# Patient Record
Sex: Male | Born: 2000 | Hispanic: Yes | Marital: Single | Smoking: Never smoker
Health system: Western US, Academic
[De-identification: ages and names within clinical notes are randomized; demographics above are authoritative.]

## PROBLEM LIST (undated history)

## (undated) DIAGNOSIS — Z789 Other specified health status: Secondary | ICD-10-CM

## (undated) HISTORY — DX: Cervicalgia: M54.2

## (undated) HISTORY — DX: Gastro-esophageal reflux disease without esophagitis: K21.9

## (undated) HISTORY — DX: Dorsalgia, unspecified: M54.9

## (undated) HISTORY — DX: Presence of spectacles and contact lenses: Z97.3

## (undated) HISTORY — DX: Other general symptoms and signs: R68.89

## (undated) HISTORY — DX: Other chronic pain: G89.29

## (undated) MED ORDER — AZITHROMYCIN 1 GM OR PACK
PACK | ORAL | 0 refills | Status: AC
Start: 2021-09-08 — End: ?

---
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
## 2020-05-14 ENCOUNTER — Emergency Department: Payer: No Typology Code available for payment source

## 2020-05-14 ENCOUNTER — Emergency Department
Admission: EM | Admit: 2020-05-14 | Discharge: 2020-05-14 | Disposition: A | Discharge status: Home / Self Care | Payer: No Typology Code available for payment source | Attending: Emergency Medicine | Admitting: Emergency Medicine

## 2020-05-14 DIAGNOSIS — R0789 Other chest pain: Secondary | ICD-10-CM

## 2020-05-14 DIAGNOSIS — F1290 Cannabis use, unspecified, uncomplicated: Secondary | ICD-10-CM | POA: Insufficient documentation

## 2020-05-14 DIAGNOSIS — R079 Chest pain, unspecified: Secondary | ICD-10-CM | POA: Insufficient documentation

## 2020-05-14 LAB — CBC WITH DIFF, BLOOD
ANC automated: 4.5 10*3/uL (ref 2.0–8.1)
Basophils %: 1 %
Basophils Absolute: 0.1 10*3/uL (ref 0.0–0.2)
Eosinophils %: 0.4 %
Eosinophils Absolute: 0 10*3/uL (ref 0.0–0.5)
Hematocrit: 43.5 % (ref 39.5–50.0)
Hgb: 15.2 G/DL (ref 13.5–16.9)
Lymphocytes %: 26.3 %
Lymphocytes Absolute: 1.8 10*3/uL (ref 0.9–3.3)
MCH: 30.4 PG (ref 27.0–33.5)
MCHC: 35 G/DL (ref 32.0–35.5)
MCV: 86.9 FL (ref 81.5–97.0)
MPV: 9.1 FL (ref 7.2–11.7)
Monocytes %: 7.1 %
Monocytes Absolute: 0.5 10*3/uL (ref 0.0–0.8)
Neutrophils % (A): 65.2 %
PLT Count: 177 10*3/uL (ref 150–400)
RBC: 5.01 10*6/uL (ref 4.38–5.62)
RDW-CV: 12.4 % (ref 11.6–14.4)
White Bld Cell Count: 6.9 10*3/uL (ref 4.0–10.5)

## 2020-05-14 LAB — BASIC METABOLIC PANEL, BLOOD
BUN: 14 mg/dL (ref 7–25)
CO2: 27 mmol/L (ref 21–31)
Calcium: 9.7 mg/dL (ref 8.6–10.3)
Chloride: 103 mmol/L (ref 98–107)
Creat: 1 mg/dL (ref 0.7–1.3)
Electrolyte Balance: 7 mmol/L (ref 2–12)
Glucose: 102 mg/dL (ref 70–115)
Potassium: 3.9 mmol/L (ref 3.5–5.1)
Sodium: 137 mmol/L (ref 136–145)
eGFR - high estimate: >60 (ref >59)
eGFR - low estimate: >60 (ref >59)

## 2020-05-14 LAB — ECG 12-LEAD
P AXIS: 83 Deg
PR INTERVAL: 170 ms
QTC INTERVAL: 395 ms
R-R INTERVAL AVERAGE: 631 ms
T AXIS: 51 Deg
VENTRICULAR RATE: 95 {beats}/min

## 2020-05-14 LAB — TROPONIN I, HIGH SENSITIVITY
Troponin I, High Sensitivity: <3 ng/L (ref 0–20)
Troponin I, High Sensitivity: <3 ng/L (ref 0–20)

## 2020-05-14 NOTE — ED Provider Notes (Signed)
CHIEF COMPLAINT  Chest Pain - Adult (C/o of palpitations, racing heart rate,  used cocaine, last use was Sunday.)      HISTORY OF PRESENT ILLNESS:   Jean Skow is a 20 year old male who presents with a chief complaint of chest pain. States 3 days ago went to a party, took something (thinks it was cocaine), and felt his heart wasn't beating normally. States has been waking up with palpitations. No hemoptysis, no unilateral leg swelling, no recent travel or surgery, no history of blood clots, no hormone use, no pleuritic chest pain. Denies fevers, chills, shortness of breath, abdominal pain, nausea, vomiting, diarrhea, dysuria, headaches, numbness, tingling, weakness.     Location: left chest  Radiation: none  Quality: fast   Severity: moderate   Duration: 3 days   Timing: intermittent     REVIEW OF SYSTEMS:  Constitutional: no fever  CV: no chest pain  Resp: no shortness of breath  GI: no vomiting  GU: no dysuria    All other systems reviewed and negative except as noted above    PAST MEDICAL HISTORY:  History reviewed. No pertinent past medical history.   There are no problems to display for this patient.       SURGICAL HISTORY:  Denies     ALLERGIES:  No Known Allergies      CURRENT MEDICATIONS:   No current outpatient medications  Please seen nursing notes    FAMILY HISTORY:  No family history on file.  Reviewed and noncontributory     SOCIAL HISTORY:  Tobacco: no  Alcohol: + socially   Drug use: + thc, + cocaine    VITAL SIGNS:  First Vitals [05/14/20 2008]   Temperature Heart Rate Respirations Blood pressure (BP) SpO2   98.5 F (36.9 C) 112 18 (!) 148/98 96 %       PHYSICAL EXAM:  General: Awake, Alert , appears to be in no distress   Head: Normocephalic, atraumatic   Eyes:No scleral icterus, no conjunctival injection   ENT: Normal appearing ears externally, normal appearing nose externally    Neck: Supple, no tracheal deviation   Respiratory: normal effort, no audible stridor   Cardiovascular:  normal  rate, warm and well perfused   Abdomen: Non-distended   Skin: No jaundice, no rash   Extremities:  No edema  Neuro: Face symmetric, normal speech    LABS:   Abnormal Labs Reviewed - No abnormal labs to display    IMAGING:  X-Ray Chest Single View   Preliminary Result   FINDINGS/      Cardiomediastinal silhouette is within normal limits. Pulmonary vasculature is within normal limits. There is no large pleural effusion. There is no focal consolidation. There is no pneumothorax.           MEDICAL DECISION MAKING:  Lawayne Hartig is a 20 year old male who presents with a chief complaint of chest pain. Patient tachycardic on arrival but no longer tachycardic on exam. Consider ACS. Consider GI causes of chest pain including gastritis vs pancreatitis. Consider musculoskeletal causes of chest pain including costochondritis. Lungs are clear to auscultation bilaterally, doubt pneumonia.  Considered but less likely PE, no risk factors, no evidence of DVT, Well's score low risk.  Strongly doubt aortic dissection vs pneumothorax vs cardiac tamponade based on history and physical exam. Will get labs, EKG, imaging.     Troponin within normal limits, EKG without signs of ischemia, no major electrolyte abnormality, x-ray within normal limits,  we will plan to discharge at this time with outpatient follow-up.    I have reviewed the patient's labs which were notable for (noted above and in ED Course) I reviewed the patient's imaging which showed (noted above and in ED Course). I have also reviewed prior records which were summarized in my HPI .    ED Course:    Workup Summary         Value Comment By Time     Troponin I, High Sensitivity: <3 (Reviewed) Abigail Miyamoto, MD 04/12 2227     Troponin I, High Sensitivity: <3 (Reviewed) Abigail Miyamoto, MD 04/12 2324      CXR without acute findings. Abigail Miyamoto, MD 04/12 2324            DIAGNOSIS:    ICD-10-CM ICD-9-CM   1. Chest pain, unspecified type  R07.9 786.50            Darnell Level, MD  Resident  05/15/20 [phone]    ATTENDING ATTESTATION:  I evaluated the patient concurrently with the Resident.  I discussed the case with the Resident and agree with the findings and plan as documented by the Resident.  Any additions or revisions are included in the record as necessary.    Abigail Miyamoto, MD         Abigail Miyamoto, MD  05/16/20 [phone]

## 2020-05-14 NOTE — ED Notes (Signed)
Pt d/c'd home with info on aci's, and follow up verbal understanding nad

## 2020-05-14 NOTE — Discharge Instructions (Addendum)
You were seen tonight for your chest pain. Your blood tests did not show any concerning findings.    Please follow-up with your primary care provider in 2-3 days for repeat evaluation and, if indicated, further management of your symptoms.    Return to the emergency department IMMEDIATELY for fever, vomiting, worsening pain, shortness of breath, fainting or passing out, or for anything else that concerns you.

## 2020-05-14 NOTE — ED EKG Interpretation (Signed)
ED EKG Interpretation    EKG: Normal Sinus Rhythm with Normal Axis and no ischemic changes.

## 2020-05-15 LAB — ECG 12-LEAD
QRS INTERVAL/DURATION: 82 ms
QT: 343 ms
R AXIS: 75 Deg

## 2021-01-15 ENCOUNTER — Encounter: Payer: Self-pay | Admitting: Family Practice

## 2021-01-15 ENCOUNTER — Ambulatory Visit: Payer: No Typology Code available for payment source | Attending: Family Practice | Admitting: Family Practice

## 2021-01-15 VITALS — BP 148/80 | HR 77 | Temp 97.6°F | Ht 67.99 in | Wt 146.2 lb

## 2021-01-15 DIAGNOSIS — Z113 Encounter for screening for infections with a predominantly sexual mode of transmission: Secondary | ICD-10-CM | POA: Insufficient documentation

## 2021-01-15 DIAGNOSIS — Z1159 Encounter for screening for other viral diseases: Secondary | ICD-10-CM | POA: Insufficient documentation

## 2021-01-15 DIAGNOSIS — Z0000 Encounter for general adult medical examination without abnormal findings: Secondary | ICD-10-CM | POA: Insufficient documentation

## 2021-01-15 DIAGNOSIS — Z6822 Body mass index (BMI) 22.0-22.9, adult: Secondary | ICD-10-CM | POA: Insufficient documentation

## 2021-01-15 DIAGNOSIS — F1290 Cannabis use, unspecified, uncomplicated: Secondary | ICD-10-CM | POA: Insufficient documentation

## 2021-01-15 DIAGNOSIS — Z7689 Persons encountering health services in other specified circumstances: Secondary | ICD-10-CM | POA: Insufficient documentation

## 2021-01-15 DIAGNOSIS — Z23 Encounter for immunization: Secondary | ICD-10-CM | POA: Insufficient documentation

## 2021-01-15 NOTE — Patient Instructions (Signed)
It was nice seeing you, Dalton Mcintosh  Please remember to follow up with [place] to review your labs together

## 2021-01-15 NOTE — Assessment & Plan Note (Signed)
Routine labs ordered  HPV vaccine #1 given; will return for second  Influenza vaccine given  Will screen for Hep C and STD

## 2021-01-15 NOTE — Progress Notes (Addendum)
[facility]    Date: 01/15/2021  Patient ID: Dalton Mcintosh, 20 year old, male  MRN: [id number]  Provider Name: Orlinda Blalock, NP    Chief Complaint: Establish Care and Physical      HPI:  Pt is a 20 year old male who presents today for c/o  Establish care and physical    He denies any acute complaints today  He has hx of cocaine use; last use was about one year ago  Reports he is currently using marijuana; 1-2 times per week for recreational use  Denies hx of anxiety or depression  No chest pain or pressure  No palpitations at this time      PHQ2 score: 0      ROS:  Negative except HPI above    Past Medical History:  Patient Active Problem List   Diagnosis   . Routine general medical examination at a health care facility   . Encounter to establish care   . Encounter for screening examination for sexually transmitted disease   . Encounter for hepatitis C screening test for low risk patient   . Marijuana use   . Need for vaccination       Past Surgical History:  No past surgical history on file.    Medications:  No current outpatient medications on file.     No current facility-administered medications for this visit.       Allergies:   Patient has no known allergies.    Family Medical History:  Family History   Problem Relation Name Age of Onset   . No Known Problems Mother     . No Known Problems Sister two sisters        Social History:  Social History     Occupational History   . Not on file   Tobacco Use   . Smoking status: Never   . Smokeless tobacco: Never   Substance and Sexual Activity   . Alcohol use: Yes     Comment: 1-2 beers on weekend sometimes   . Drug use: Yes     Types: Cocaine, Marijuana     Comment: weed; weekly; no longer using cocaine   . Sexual activity: Yes     Partners: Female     Birth control/protection: Condom         Vital Signs:  BP 148/80   Pulse 77   Temp 97.6 F (36.4 C)   Ht 5' 7.99" (1.727 m)   Wt 66.3 kg (146 lb 2.6 oz)   BMI 22.23 kg/m   Body mass  index is 22.23 kg/m.    Physical Examination:  Physical Exam  Vitals reviewed.   HENT:      Head: Normocephalic.      Comments: Wearing mask for COVID precautions     Right Ear: External ear normal.      Left Ear: External ear normal.   Cardiovascular:      Rate and Rhythm: Normal rate and regular rhythm.   Pulmonary:      Effort: Pulmonary effort is normal. No respiratory distress.   Skin:     General: Skin is warm and dry.   Neurological:      Mental Status: He is alert and oriented to person, place, and time.   Psychiatric:         Mood and Affect: Mood normal.           Recent Labs:    Admission on  05/14/2020, Discharged on 05/14/2020   Component Date Value Ref Range Status   . ECG INTERPRETATION 05/14/2020 SINUS RHYTHM NORMAL ECG  Reviewed By Earnie Larsson, MD  05/15/2020 12:02:42 PM   Final   . VENTRICULAR RATE 05/14/2020 95  BPM Final   . PR INTERVAL 05/14/2020 170  ms Final   . QRS INTERVAL/DURATION 05/14/2020 82  ms Final   . QT 05/14/2020 343  ms Final   . QTC INTERVAL 05/14/2020 395  ms Final   . P AXIS 05/14/2020 82  Deg Final   . R AXIS 05/14/2020 75  Deg Final   . T AXIS 05/14/2020 51  Deg Final   . R-R INTERVAL AVERAGE 05/14/2020 631  ms Final   . White Bld Cell Count 05/14/2020 6.9  4.0 - 10.5 THOUS/MCL Final   . RBC 05/14/2020 5.01  4.38 - 5.62 MILL/MCL Final   . Hgb 05/14/2020 15.2  13.5 - 16.9 G/DL Final   . Hematocrit 05/14/2020 43.5  39.5 - 50.0 % Final   . MCV 05/14/2020 86.9  81.5 - 97.0 FL Final   . MCH 05/14/2020 30.4  27.0 - 33.5 PG Final   . MCHC 05/14/2020 35.0  32.0 - 35.5 G/DL Final   . RDW-CV 05/14/2020 12.4  11.6 - 14.4 % Final   . PLT Count 05/14/2020 177  150 - 400 THOUS/MCL Final   . MPV 05/14/2020 9.1  7.2 - 11.7 FL Final   . Diff Type 05/14/2020 DIFFERENTIAL PERFORMED BY AUTOMATED ANALYSIS   Final   . Neutrophils % (A) 05/14/2020 65.2  % Final   . ANC automated 05/14/2020 4.5  2.0 - 8.1 THOUS/MCL Final   . Lymphocytes % 05/14/2020 26.3  % Final   . Lymphocytes Absolute 05/14/2020 1.8   0.9 - 3.3 THOUS/MCL Final   . Monocytes % 05/14/2020 7.1  % Final   . Monocytes Absolute 05/14/2020 0.5  0.0 - 0.8 THOUS/MCL Final   . Eosinophils % 05/14/2020 0.4  % Final   . Eosinophils Absolute 05/14/2020 0.0  0.0 - 0.5 THOUS/MCL Final   . Basophils % 05/14/2020 1.0  % Final   . Basophils Absolute 05/14/2020 0.1  0.0 - 0.2 THOUS/MCL Final   . Sodium 05/14/2020 137  136 - 145 mmol/L Final   . Potassium 05/14/2020 3.9  3.5 - 5.1 mmol/L Final   . Chloride 05/14/2020 103  98 - 107 mmol/L Final   . CO2 05/14/2020 27  21 - 31 mmol/L Final   . Electrolyte Balance 05/14/2020 7  2 - 12 mmol/L Final   . Glucose 05/14/2020 102  70 - 115 mg/dL Final    Comment:    Normal Fasting Glucose:  <100 mg/dL  Impaired Fasting Glucose: 100-125 mg/dL  Provisional DX of diabetes(must be confirmed) >125 mg/dL     . BUN 05/14/2020 14  7 - 25 mg/dL Final   . Creat 05/14/2020 1.0  0.7 - 1.3 mg/dL Final   . eGFR - low estimate 05/14/2020 >60  >59 Final   . eGFR - high estimate 05/14/2020 >60  >59 Final    Comment: (Unit: mL/min/1.73 sq mtr)  The calculated GFR is an estimate and is NOT an accurate reflection of GFR in   patients on dialysis. The accuracy of estimated GFR is also affected by muscle   mass, and medications that affect renal tubular secretion of creatinine.  If estimated GFR will directly affect clinical decision making, consider using cystatin C to estimate GFR, and/or  consult with nephrology. eGFR was calculated using the MDRD equation (2006).     . Calcium 05/14/2020 9.7  8.6 - 10.3 mg/dL Final   . Troponin I, High Sensitivity 05/14/2020 <3  0 - 20 ng/L Final    Comment:    These values were determined with a high sensitivity troponin I assay. The 99th   percentile upper reference limits established by the manufacturer are:       Females: 15 ng/L       Males:   20 ng/L       Overall: 18 ng/L     . Troponin I, High Sensitivity 05/14/2020 <3  0 - 20 ng/L Final    Comment:    These values were determined with a high  sensitivity troponin I assay. The 99th   percentile upper reference limits established by the manufacturer are:       Females: 15 ng/L       Males:   20 ng/L       Overall: 18 ng/L               Assessment and Plan:  Problem List Items Addressed This Visit        Other    Routine general medical examination at a health care facility - Primary     Routine labs ordered  HPV vaccine #1 given; will return for second  Influenza vaccine given  Will screen for Hep C and STD         Relevant Orders    CBC w/ Diff Lavender    Lipid Panel Green Plasma Separator Tube    Comprehensive Metabolic Panel    Vitamin D, 25-OH Total Yellow serum separator tube    Vitamin B12, Blood Green Plasma Separator Tube    HIV 1/2 Antibody & P24 Antigen Assay    C. trachomatis + N. gonorrhoeae by PCR    Syphilis Screen, Blood Yellow serum separator tube    Hepatitis C Antibody    Encounter to establish care    Relevant Orders    CBC w/ Diff Lavender    Lipid Panel Green Plasma Separator Tube    Comprehensive Metabolic Panel    Vitamin D, 25-OH Total Yellow serum separator tube    Vitamin B12, Blood Green Plasma Separator Tube    HIV 1/2 Antibody & P24 Antigen Assay    C. trachomatis + N. gonorrhoeae by PCR    Syphilis Screen, Blood Yellow serum separator tube    Hepatitis C Antibody    Encounter for screening examination for sexually transmitted disease    Relevant Orders    CBC w/ Diff Lavender    Lipid Panel Green Plasma Separator Tube    Comprehensive Metabolic Panel    Vitamin D, 25-OH Total Yellow serum separator tube    Vitamin B12, Blood Green Plasma Separator Tube    HIV 1/2 Antibody & P24 Antigen Assay    C. trachomatis + N. gonorrhoeae by PCR    Syphilis Screen, Blood Yellow serum separator tube    Hepatitis C Antibody    Encounter for hepatitis C screening test for low risk patient    Relevant Orders    CBC w/ Diff Lavender    Lipid Panel Green Plasma Separator Tube    Comprehensive Metabolic Panel    Vitamin D, 25-OH Total Yellow  serum separator tube    Vitamin B12, Blood Green Plasma Separator Tube    HIV 1/2 Antibody & P24 Antigen Assay  C. trachomatis + N. gonorrhoeae by PCR    Syphilis Screen, Blood Yellow serum separator tube    Hepatitis C Antibody    Marijuana use     Encouraged cessation  Pt verbalized understanding          Need for vaccination

## 2021-01-15 NOTE — Assessment & Plan Note (Signed)
Encouraged cessation  Pt verbalized understanding

## 2021-01-29 ENCOUNTER — Ambulatory Visit: Payer: No Typology Code available for payment source | Admitting: Family Practice

## 2021-02-13 ENCOUNTER — Ambulatory Visit: Payer: No Typology Code available for payment source | Admitting: Nurse Practitioner

## 2021-02-15 LAB — CBC WITH DIFF, BLOOD
Abs Basophils: 36 cells/uL (ref 0–200)
Abs Eosinophils: 18 cells/uL (ref 15–500)
Abs Lymphs: 1050 cells/uL (ref 850–3900)
Abs Monocytes: 570 cells/uL (ref 200–950)
Abs Neutrophils: 7227 cells/uL (ref 1500–7800)
Basophils: 0.4 %
Eosinophils: 0.2 %
HCT: 47.4 % (ref 38.5–50.0)
HGB: 16.2 g/dL (ref 13.2–17.1)
Lymps: 11.8 %
MCH: 30.3 pg (ref 27.0–33.0)
MCHC: 34.2 g/dL (ref 32.0–36.0)
MCV: 88.8 fL (ref 80.0–100.0)
MPV: 12.1 fL (ref 7.5–12.5)
Monocytes: 6.4 %
PLT: 212 10*3/uL (ref 140–400)
RBC: 5.34 10*6/uL (ref 4.20–5.80)
RDW: 12.1 % (ref 11.0–15.0)
SEGS: 81.2 %
WBC: 8.9 10*3/uL (ref 3.8–10.8)

## 2021-02-15 LAB — COMPREHENSIVE METABOLIC PANEL, BLOOD
ALT (SGPT): 24 U/L (ref 9–46)
AST (SGOT): 17 U/L (ref 10–40)
Albumin/Glob Ratio: 2.3 (calc) (ref 1.0–2.5)
Albumin: 5.4 g/dL — ABNORMAL HIGH (ref 3.6–5.1)
Alkaline Phos: 83 U/L (ref 36–130)
BUN: 14 mg/dL (ref 7–25)
Bilirubin, Total: 1.4 mg/dL — ABNORMAL HIGH (ref 0.2–1.2)
Calcium: 10.7 mg/dL — ABNORMAL HIGH (ref 8.6–10.3)
Carbon Dioxide: 32 mmol/L (ref 20–32)
Chloride: 103 mmol/L (ref 98–110)
Creatinine: 0.9 mg/dL (ref 0.60–1.24)
EGFR: 124 mL/min/{1.73_m2} (ref >=60)
Globulin: 2.4 g/dL (calc) (ref 1.9–3.7)
Glucose: 111 mg/dL — ABNORMAL HIGH (ref 65–99)
Potassium: 4.4 mmol/L (ref 3.5–5.3)
Sodium: 142 mmol/L (ref 135–146)
Total Protein: 7.8 g/dL (ref 6.1–8.1)

## 2021-02-15 LAB — LIPID(CHOL FRACT) PANEL, BLOOD
Chol/HDLC Ratio: 2.1 (calc) (ref <5.0)
Cholesterol: 212 mg/dL — ABNORMAL HIGH (ref <200)
HDL Cholesterol: 103 mg/dL (ref >=40)
LDL-Cholesterol: 86 mg/dL (calc)
Non-HDL Cholesterol: 109 mg/dL (calc) (ref <130)
Triglycerides: 132 mg/dL (ref <150)

## 2021-02-15 LAB — HIV 1/2 ANTIBODY & P24 ANTIGEN ASSAY, BLOOD: HIV AG/AB, 4th Gen: NONREACTIVE

## 2021-02-15 LAB — VITAMIN B12, BLOOD: Vitamin B12: 396 pg/mL (ref 200–1100)

## 2021-02-15 LAB — SYPHILIS EIA SCREEN, BLOOD: T.pallidum Ab: NEGATIVE

## 2021-02-15 LAB — HEPATITIS C AB, BLOOD
Hepatis C AB Signal/Cut Off: 0.06 (ref <1.00)
Hepatitis C Ab: NONREACTIVE

## 2021-02-15 LAB — VITAMIN D, 25-OH TOTAL: Vitamin D, 25-OH, Total: 16 ng/mL — ABNORMAL LOW (ref 30–100)

## 2021-03-05 ENCOUNTER — Ambulatory Visit: Payer: No Typology Code available for payment source | Attending: Family Practice | Admitting: Family Practice

## 2021-03-05 ENCOUNTER — Encounter: Payer: Self-pay | Admitting: Family Practice

## 2021-03-05 VITALS — BP 136/80 | HR 71 | Temp 97.0°F | Resp 16 | Ht 67.0 in | Wt 140.4 lb

## 2021-03-05 DIAGNOSIS — Z7182 Exercise counseling: Secondary | ICD-10-CM | POA: Insufficient documentation

## 2021-03-05 DIAGNOSIS — Z6821 Body mass index (BMI) 21.0-21.9, adult: Secondary | ICD-10-CM | POA: Insufficient documentation

## 2021-03-05 DIAGNOSIS — R17 Unspecified jaundice: Secondary | ICD-10-CM | POA: Insufficient documentation

## 2021-03-05 DIAGNOSIS — Z713 Dietary counseling and surveillance: Secondary | ICD-10-CM | POA: Insufficient documentation

## 2021-03-05 DIAGNOSIS — F1290 Cannabis use, unspecified, uncomplicated: Secondary | ICD-10-CM | POA: Insufficient documentation

## 2021-03-05 NOTE — Patient Instructions (Signed)
It was nice seeing you, Dalton Mcintosh  Please remember to see the dietician to review healthier eating habits

## 2021-03-05 NOTE — Progress Notes (Signed)
 [name]    Date: 03/05/2021  Patient ID: Dalton Mcintosh, 21 year old, male  MRN: [id number]  Provider Name: Harvie Bridge, NP    Chief Complaint: Lab Review      HPI:  Pt is a 21 year old male who presents today for c/o    #Lab

## 2021-03-05 NOTE — Assessment & Plan Note (Signed)
Remission  Quit 2 weeks ago

## 2021-03-18 DIAGNOSIS — Z713 Dietary counseling and surveillance: Secondary | ICD-10-CM | POA: Insufficient documentation

## 2021-03-18 DIAGNOSIS — R17 Unspecified jaundice: Secondary | ICD-10-CM | POA: Insufficient documentation

## 2021-03-18 DIAGNOSIS — Z7182 Exercise counseling: Secondary | ICD-10-CM | POA: Insufficient documentation

## 2021-03-18 LAB — CALCIUM, IONIZED BLOOD: Calcium, Ionized: 5.2 mg/dL (ref 4.8–5.6)

## 2021-03-18 LAB — PTH INTACT, BLOOD: Parathyroid Hormone, Intact: 67 pg/mL (ref 16–77)

## 2021-03-18 LAB — TSH, BLOOD: TSH: 2.95 mIU/L (ref 0.40–4.50)

## 2021-03-18 NOTE — Assessment & Plan Note (Signed)
Encouraged TLC and daily exercise

## 2021-03-18 NOTE — Assessment & Plan Note (Signed)
Will recheck levels

## 2021-04-02 ENCOUNTER — Encounter: Payer: Self-pay | Admitting: Family Practice

## 2021-04-03 ENCOUNTER — Ambulatory Visit: Payer: No Typology Code available for payment source | Admitting: Family Practice

## 2021-04-15 ENCOUNTER — Telehealth: Payer: Self-pay

## 2021-04-15 ENCOUNTER — Telehealth: Payer: Self-pay | Admitting: Family Practice

## 2021-04-15 NOTE — Telephone Encounter (Signed)
patient requesting sooner appointment.     Reason for sooner appointment:  Discuss results    Tentatively scheduled with Provider: Roswell Nickel at Date/Time: 3-24 Location: [place].    Please assist.      Wants to come in sooner

## 2021-04-15 NOTE — Telephone Encounter (Signed)
-----   Message from Janece Canterbury sent at 04/14/2021  7:09 PM PDT -----  Regarding: Appointment Request  Contact: [phone]  Appointment Request From: Dalton Mcintosh    With Provider: Harvie Bridge, NP [facility]]    Prefer

## 2021-04-15 NOTE — Telephone Encounter (Signed)
CALLED PATIENT LEFT VM REQUEST TO CALL BACK TO FURTHER ASSIST WITH NEXT AVAILABLE APPOINTMENT.

## 2021-04-15 NOTE — Telephone Encounter (Signed)
LEFT VOICEMAIL FOR PATIENT TO RETURN CALL TO SCHEDULE TELE MED FOR SOONER APPOINTMENT WITH PCP    NO SOONER IN PERSON APPOINTMENT AVAILABLE AT THE MOMENT

## 2021-04-25 ENCOUNTER — Other Ambulatory Visit: Payer: Self-pay

## 2021-04-25 ENCOUNTER — Ambulatory Visit: Payer: No Typology Code available for payment source | Attending: Family Medicine

## 2021-04-25 VITALS — BP 131/68 | HR 83 | Temp 98.0°F | Ht 67.0 in | Wt 140.0 lb

## 2021-04-25 DIAGNOSIS — A749 Chlamydial infection, unspecified: Secondary | ICD-10-CM

## 2021-04-25 DIAGNOSIS — Z6821 Body mass index (BMI) 21.0-21.9, adult: Secondary | ICD-10-CM | POA: Insufficient documentation

## 2021-04-25 MED ORDER — AZITHROMYCIN POWD
1.0000 | Freq: Every day | 0 refills | Status: DC
Start: 2021-04-25 — End: 2021-04-25

## 2021-04-25 MED ORDER — AZITHROMYCIN 1 GM OR PACK
PACK | ORAL | 0 refills | Status: AC
Start: 2021-04-25 — End: ?

## 2021-04-25 NOTE — Progress Notes (Signed)
 Subjective:   Dalton Mcintosh is a 21 year old male who is here for No chief complaint on file.    #Chlamydia  Patient states that he tested positive for chlamydia on 04-16-21 and was rx'ed Doxycycline, but prefers singe dose of Azithromycin  Pt denies an

## 2021-05-26 NOTE — Progress Notes (Signed)
[place] [facility] of [facility]    Patient ID: Dalton Mcintosh 21 year old, male  MRN: [id number]  Provider Name: Carlton Adam, PA    Chief Complaint: Complete Physicial Exam    HPI:  Pt is a 21 year old male who presents today for a complete physical exam with no c/o at this time      PHQ2 score: No flowsheet data found.    ROS:  Review of Systems   Constitutional: Negative.    HENT: Negative.    Eyes: Negative.    Respiratory: Negative.    Cardiovascular: Negative.    Gastrointestinal: Negative.    Endocrine: Negative.    Genitourinary: Negative.    Musculoskeletal: Negative.    Skin: Negative.    Allergic/Immunologic: Negative.    Neurological: Negative.    Hematological: Negative.    Psychiatric/Behavioral: Negative.         Past Medical History:  Patient Active Problem List   Diagnosis   . Routine general medical examination at a health care facility   . Encounter to establish care   . Encounter for screening examination for sexually transmitted disease   . Encounter for hepatitis C screening test for low risk patient   . Marijuana use   . Need for vaccination   . Exercise counseling   . Nutritional counseling   . Increased bilirubin level   . Serum calcium elevated       Past Surgical History:  No past surgical history on file.    Medications:  Current Outpatient Medications   Medication Sig Dispense Refill   . azithromycin (ZITHROMAX) 1 g packet TAKE 1 PACKAGE BY MOUTH DAILY. 3 packet 0     No current facility-administered medications for this visit.       Allergies:   Patient has no known allergies.    Family Medical History:  Family History   Problem Relation Name Age of Onset   . No Known Problems Mother     . No Known Problems Sister two sisters        Social History:  Social History     Occupational History   . Not on file   Tobacco Use   . Smoking status: Never   . Smokeless tobacco: Never   Vaping Use   . Vaping status: Not on file   Substance and Sexual Activity   . Alcohol use: Yes     Comment:  1-2 beers on weekend sometimes   . Drug use: Yes     Types: Cocaine, Marijuana     Comment: weed; weekly; no longer using cocaine   . Sexual activity: Yes     Partners: Female     Birth control/protection: Condom         Vital Signs:  There were no vitals taken for this visit.  There is no height or weight on file to calculate BMI.    Physical Examination:  Physical Exam  Constitutional:       Appearance: Normal appearance.   HENT:      Head: Normocephalic and atraumatic.      Right Ear: Tympanic membrane, ear canal and external ear normal.      Left Ear: Tympanic membrane, ear canal and external ear normal.      Nose: Nose normal.      Mouth/Throat:      Mouth: Mucous membranes are dry.      Pharynx: Oropharynx is clear.   Eyes:  Extraocular Movements: Extraocular movements intact.      Conjunctiva/sclera: Conjunctivae normal.      Pupils: Pupils are equal, round, and reactive to light.   Cardiovascular:      Rate and Rhythm: Normal rate and regular rhythm.      Pulses: Normal pulses.      Heart sounds: Normal heart sounds.   Pulmonary:      Effort: Pulmonary effort is normal.      Breath sounds: Normal breath sounds.   Abdominal:      General: Abdomen is flat. Bowel sounds are normal.      Palpations: Abdomen is soft.   Genitourinary:     Penis: Normal.       Testes: Normal.   Musculoskeletal:         General: Normal range of motion.      Cervical back: Normal range of motion and neck supple.   Skin:     General: Skin is warm and dry.      Capillary Refill: Capillary refill takes 2 to 3 seconds.   Neurological:      General: No focal deficit present.      Mental Status: He is alert and oriented to person, place, and time.   Psychiatric:         Mood and Affect: Mood normal.         Behavior: Behavior normal.         Thought Content: Thought content normal.         Judgment: Judgment normal.           Assessment and Plan:  Problem List Items Addressed This Visit    None

## 2021-05-27 ENCOUNTER — Ambulatory Visit: Payer: No Typology Code available for payment source

## 2021-05-27 ENCOUNTER — Ambulatory Visit: Payer: No Typology Code available for payment source | Attending: Family Medicine

## 2021-05-27 VITALS — BP 114/69 | HR 73 | Temp 98.1°F | Resp 16 | Ht 67.0 in | Wt 137.9 lb

## 2021-05-27 DIAGNOSIS — Z6821 Body mass index (BMI) 21.0-21.9, adult: Secondary | ICD-10-CM | POA: Insufficient documentation

## 2021-05-27 DIAGNOSIS — Z0000 Encounter for general adult medical examination without abnormal findings: Secondary | ICD-10-CM | POA: Insufficient documentation

## 2021-06-13 ENCOUNTER — Ambulatory Visit: Payer: No Typology Code available for payment source

## 2021-06-13 NOTE — Progress Notes (Signed)
This encounter was opened in error.  Please disregard.

## 2021-07-01 ENCOUNTER — Ambulatory Visit: Payer: No Typology Code available for payment source

## 2021-09-08 ENCOUNTER — Other Ambulatory Visit: Payer: Self-pay | Admitting: Family Medicine

## 2021-09-08 DIAGNOSIS — A749 Chlamydial infection, unspecified: Secondary | ICD-10-CM

## 2021-09-15 ENCOUNTER — Telehealth: Payer: Self-pay | Admitting: Family Practice

## 2021-09-15 NOTE — Telephone Encounter (Signed)
CALLED PATIENT LEFT VM REQUEST TO CALL BACK TO FURTHER ASSIST WITH AN APPOINTMENT.

## 2021-09-15 NOTE — Telephone Encounter (Signed)
-----   Message from Janece Canterbury sent at 09/14/2021  2:41 PM PDT -----  Regarding: Appointment Request  Contact: [phone]  Appointment Request From: Fanny Bien    With Provider: Harvie Bridge, NP [facility]]    Preferred Date Range: 09/15/2021 - 09/15/2021    Preferred Times: Any Time    Reason for visit: Check up    Comments:  I want to get my blood work done and check for everything

## 2021-12-14 ENCOUNTER — Emergency Department (EMERGENCY_DEPARTMENT_HOSPITAL): Payer: No Typology Code available for payment source

## 2021-12-14 ENCOUNTER — Emergency Department
Admission: EM | Admit: 2021-12-14 | Discharge: 2021-12-14 | Disposition: A | Discharge status: Home / Self Care | Payer: No Typology Code available for payment source | Attending: Student in an Organized Health Care Education/Training Program | Admitting: Student in an Organized Health Care Education/Training Program

## 2021-12-14 DIAGNOSIS — R0789 Other chest pain: Secondary | ICD-10-CM | POA: Insufficient documentation

## 2021-12-14 DIAGNOSIS — R059 Cough, unspecified: Secondary | ICD-10-CM | POA: Insufficient documentation

## 2021-12-14 DIAGNOSIS — R0602 Shortness of breath: Secondary | ICD-10-CM | POA: Insufficient documentation

## 2021-12-14 DIAGNOSIS — F17200 Nicotine dependence, unspecified, uncomplicated: Secondary | ICD-10-CM | POA: Insufficient documentation

## 2021-12-14 DIAGNOSIS — R109 Unspecified abdominal pain: Secondary | ICD-10-CM

## 2021-12-14 DIAGNOSIS — B349 Viral infection, unspecified: Secondary | ICD-10-CM | POA: Insufficient documentation

## 2021-12-14 DIAGNOSIS — Z1152 Encounter for screening for COVID-19: Secondary | ICD-10-CM | POA: Insufficient documentation

## 2021-12-14 DIAGNOSIS — K219 Gastro-esophageal reflux disease without esophagitis: Secondary | ICD-10-CM | POA: Insufficient documentation

## 2021-12-14 LAB — COVID/FLU A B/RSV PANEL
COVID-19 Result: NOT DETECTED
Influenza A, PCR: NOT DETECTED
Influenza B, PCR: NOT DETECTED
Respiratory Syncytial Virus PCR: NOT DETECTED

## 2021-12-14 MED ORDER — BENZONATATE 100 MG OR CAPS
100.0000 mg | ORAL_CAPSULE | Freq: Three times a day (TID) | ORAL | 0 refills | Status: AC | PRN
Start: 2021-12-14 — End: 2021-12-17

## 2021-12-14 MED ORDER — BENZONATATE 100 MG OR CAPS
100.0000 mg | ORAL_CAPSULE | Freq: Once | ORAL | Status: AC
Start: 2021-12-14 — End: 2021-12-14
  Administered 2021-12-14: 100 mg via ORAL
  Filled 2021-12-14: qty 1

## 2021-12-14 NOTE — ED Provider Notes (Signed)
CHIEF COMPLAINT:  Cough (Cough, abdominal pain, chest tightness x 1 week. )     HISTORY OF PRESENT ILLNESS:  Interpreter used: No (English Preferred Language)    Domonick Sittner is a 21 year old male who presents with cough, rhinnorhea and chest tightness x1 week. Assoc/w mild SOB. Denies f/ch, CP, n/v. Cough is worse when he wakes up in the morning and at night. Denies burning chest pain or a sour taste in the mouth. Denies sick contacts. Reports he went to [place] on Monday, given a prescription for reflux. +smoker. Denies pmh, meds, allergies.              PAST MEDICAL HISTORY:  History reviewed. No pertinent past medical history.   Patient Active Problem List    Diagnosis Date Noted    Exercise counseling 03/18/2021    Nutritional counseling 03/18/2021    Increased bilirubin level 03/18/2021     Abd U/S 03/28/21 no acute finding; possible fatty liver.       Serum calcium elevated 03/18/2021    Routine general medical examination at a health care facility 01/15/2021    Encounter to establish care 01/15/2021    Encounter for screening examination for sexually transmitted disease 01/15/2021    Encounter for hepatitis C screening test for low risk patient 01/15/2021    Marijuana use 01/15/2021    Need for vaccination 01/15/2021     SURGICAL HISTORY:  No past surgical history on file.     ALLERGIES:  No Known Allergies      FAMILY HISTORY:  Reviewed and considered non-contributory     SOCIAL HISTORY/DETERMINANTS OF HEALTH:  Social History     Socioeconomic History    Marital status: Single   Tobacco Use    Smoking status: Never    Smokeless tobacco: Never   Substance and Sexual Activity    Alcohol use: Yes     Comment: 1-2 beers on weekend sometimes    Drug use: Yes     Types: Cocaine, Marijuana     Comment: weed; weekly; no longer using cocaine    Sexual activity: Yes     Partners: Female     Birth control/protection: Condom   Other Topics Concern    Special Diet No    Exercises Regularly No   Social History Narrative     Occupation: none     Social Determinants of Health     Intimate Partner Violence: Low Risk  (12/19/2021)    [place] IPV     IPV Risk Score: 0      VITAL SIGNS:  First Vitals [12/14/21 1952]   Temperature Heart Rate Respirations Blood pressure (BP) SpO2   97.9 F (36.6 C) 96 18 (!) 148/96 99 %     PHYSICAL EXAM:  General: Awake, alert, in no apparent distress.  Head: Normocephalic, atraumatic.  Respiratory: CTABL. Breathing comfortably and in no respiratory distress. No audible wheezing or stridor.   Cardiovascular: RRR. Extremities are warm and well perfused.  Neuro: Awake and alert. Moving extremities.  Psych: Answers questions appropriately.  Physical Exam       MEDICAL DECISION MAKING:    Initial Impressions:  Sonam Huelsmann is a 22 year old male who presents with cough, rhinnorhea and chest tightness x1 week.   Vital signs and physical exam were notable for: VSS, benign exam  Differential diagnosis includes: URI, flu, covid, PNA.  No suspicion for ACS given age, lack of risk factors, and presence of cough/rhinnorhea. Minimal  suspicion for electrolyte disturbance given no n/v/d, tolerating PO.    Workup Review:  Pertinent Lab Results (interpreted independently by me): RVP negative.   Pertinent Imaging Results (interpreted independently by me): CXR negative for PTX or consolidation.     Symptoms consistent with likely viral syndrome. No evidence of PNA on CXR. Discussed results with patient. Answered all questions. Okay for discharge with close outpatient follow-up and return precautions.      Disposition Decision:  Discharge                                                 Patient's exam and workup were reassuring. They are stable for discharge at this time and I recommend that they pursue outpatient follow up for their complaint. Questions answered and return precautions provided.     Discharge Medication List as of 12/14/2021 10:49 PM        START taking these medications    Details   benzonatate (TESSALON)  100 MG capsule Take 1 capsule (100 mg) by mouth every 8 hours as needed for Cough for up to 3 days., Disp-10 capsule, R-0, ePrescribe           No discharge procedures on file.              The following work up was performed during the encounter. Orders placed after a disposition has been selected will not be listed here. A complete account of orders should be referenced elsewhere:     ED Orders (From admission, onward)      Ordered     Status Ordering Provider    12/14/21 2127  benzonatate (TESSALON) capsule 100 mg  ONCE         Last MAR action: Given - by MASIDDO, MARS ELI SANTOS on 12/14/21 at 2249 Vernetta Honey    12/14/21 2127  X-Ray Chest Frontal And Lateral  ONE TIME         Final result Vernetta Honey    12/14/21 1956  COVID-19/Flu A B/RSV Panel  ONCE         Final result MCCOY, CHRISTOPHER E             ED COURSE/PATIENT REASSESSMENTS:  "ED Course" is listed below that provides real time documentation during ER encounter and will provide further context to the MDM discussion above.    Workup Summary       There is no data filed.              DIAGNOSIS:    ICD-10-CM ICD-9-CM   1. Acute viral syndrome  B34.9 079.99                           Vernetta Honey, MD  12/19/21 [id number]

## 2021-12-14 NOTE — Discharge Instructions (Addendum)
-   Take tylenol and/or ibuprofen as needed for pain or fever.  - Cough drops, tea with honey or ginger, and humidified air can help with coughing.  - Take tessalon perles if needed for cough.  - Follow up with your primary doctor within 2-3 days.  - Return to the ER for any new or worsening symptoms, such as chest pain, shortness of breath, repeated vomiting, inability to tolerate food or water.

## 2021-12-14 NOTE — ED Notes (Signed)
Pt cleared for DC from traige by ER MD. Pt states full verbal understanding of AVS and/or RX, and to follow up with PCP.   All questions answered. IV lines removed. Pt condition improved from intial chief complaints. All other systems not assesed.    Pt a/o x 4, ABC's intact, NAD.

## 2022-03-03 ENCOUNTER — Telehealth: Payer: Self-pay | Admitting: Family Practice

## 2022-03-03 NOTE — Telephone Encounter (Signed)
CALLED PATIENT LEFT VM REQUEST TO CALL BACK TO FURTHER ASSIST WITH AN APPOINTMENT.

## 2022-03-03 NOTE — Telephone Encounter (Signed)
-----  Message from Onalee Hua sent at 03/03/2022 10:58 AM PST -----  Regarding: Appointment Request  Contact: [phone]  Appointment Request From: Annett Fabian    With Provider: Orlinda Blalock, NP [[place]    Preferred Date Range: Any date 03/05/2022 or later    Preferred Times: Any Time    Reason for visit: Check up    Comments:  Check up and check my right hand

## 2022-07-14 ENCOUNTER — Other Ambulatory Visit: Payer: Self-pay

## 2022-07-14 NOTE — Interdisciplinary (Signed)
Health Coach Documentation - Follow Up Note    Date of Service: July 14, 2022  PCP: No Pcp, Per Patient    Communication Type: Phone 1st Attempt  Visit/Contact Type: 1st touch    Comment  Outreach call placed to schedule physical exam - due for depression screening. LVM with call back number to schedule [phone].    Corinne Ports, Health Coach

## 2022-08-11 ENCOUNTER — Telehealth: Payer: Self-pay | Admitting: Family Practice

## 2022-08-11 NOTE — Telephone Encounter (Signed)
-----   Message from Janece Canterbury sent at 08/11/2022  9:57 AM PDT -----  Regarding: Appointment Request  Contact: [phone]  Appointment Request From: Dalton Mcintosh    With Provider: Harvie Bridge [facility]]    Preferred Date Range: 08/13/2022 - 08/13/2022    Preferred Times: Thursday Morning    Reason for visit: Yearly check up    Comments:  Full check up

## 2022-08-11 NOTE — Telephone Encounter (Signed)
CALLED PATIENT LEFT VM REQUEST TO CALL US BACK TO FURTHER ASSIST WITH AN APPOINTMENT.

## 2022-12-28 ENCOUNTER — Emergency Department
Admission: EM | Admit: 2022-12-28 | Discharge: 2022-12-29 | Disposition: A | Discharge status: Hospice | Payer: No Typology Code available for payment source

## 2022-12-28 ENCOUNTER — Emergency Department (EMERGENCY_DEPARTMENT_HOSPITAL): Payer: No Typology Code available for payment source

## 2022-12-28 DIAGNOSIS — F1290 Cannabis use, unspecified, uncomplicated: Secondary | ICD-10-CM | POA: Insufficient documentation

## 2022-12-28 DIAGNOSIS — R319 Hematuria, unspecified: Secondary | ICD-10-CM | POA: Insufficient documentation

## 2022-12-28 DIAGNOSIS — Z758 Other problems related to medical facilities and other health care: Secondary | ICD-10-CM | POA: Insufficient documentation

## 2022-12-28 HISTORY — DX: Other specified health status: Z78.9

## 2022-12-28 LAB — HEMOGRAM, BLOOD - ~~LOC~~
HCT: 44 % (ref 39.5–50.0)
HGB: 15.2 g/dL (ref 13.5–16.9)
MCH: 31.5 pg (ref 27.0–33.5)
MCHC: 34.6 g/dL (ref 32.0–35.5)
MCV: 91.1 fL (ref 81.5–97.0)
MPV: 10.1 fL (ref 7.2–11.7)
PLT Count: 200 10*3/uL (ref 150–400)
RBC: 4.83 10*6/uL (ref 4.38–5.62)
RDW-CV: 12.6 % (ref 11.6–14.4)
WBC: 6.5 10*3/uL (ref 4.0–10.5)

## 2022-12-28 LAB — COMPREHENSIVE METABOLIC PANEL, BLOOD - ~~LOC~~
ALT: 36 U/L (ref 7–52)
AST: 39 U/L (ref 13–39)
Albumin: 4.6 g/dL (ref 4.2–5.5)
Alkaline Phosphatase: 82 U/L (ref 34–104)
BUN: 13 mg/dL (ref 7–25)
Bilirubin, Total: 0.7 mg/dL (ref <=1.4)
Calcium: 10 mg/dL (ref 8.6–10.3)
Carbon Dioxide: 28 mmol/L (ref 21–31)
Chloride: 105 mmol/L (ref 98–107)
Creatinine: 0.9 mg/dL (ref 0.7–1.3)
Electrolyte Balance: 9 mmol/L (ref 2–12)
Glucose: 100 mg/dL (ref 70–115)
Potassium: 4.1 mmol/L (ref 3.5–5.1)
Protein, Total: 6.9 g/dL (ref 6.0–8.3)
Sodium: 142 mmol/L (ref 136–145)
eGFR: >120 mL/min/{1.73_m2} (ref >=60)

## 2022-12-28 LAB — URINALYSIS WITH CULTURE REFLEX, WHEN INDICATED - ~~LOC~~
Bilirubin: NEGATIVE
Glucose: NEGATIVE mg/dL
Ketones, Urine: NEGATIVE mg/dL
Leukocyte Esterase: NEGATIVE
Nitrite: NEGATIVE
Protein: 10 mg/dL — AB
RBC: >182 /[HPF] — ABNORMAL HIGH (ref 0–3)
Specific Gravity, Urine: 1.023 (ref 1.003–1.030)
Squamous Epithelial: 0 /[LPF] (ref 0–10)
UA Culture: NEGATIVE
Urobilinogen: <2 mg/dL (ref <2)
WBC: 1 /[HPF] (ref <=5)
pH, Urine: 7 (ref 5.0–8.0)

## 2022-12-28 LAB — DIFFERENTIAL - ~~LOC~~
Basophils %: 0.9 % (ref 0.0–2.0)
Basophils Absolute: 0.1 10*3/uL (ref 0.0–0.2)
Eosinophils %: 2 % (ref 0.0–7.0)
Eosinophils Absolute: 0.1 10*3/uL (ref 0.0–0.5)
Lymphocytes %: 31.5 % (ref 14.0–52.0)
Lymphocytes Absolute: 2 10*3/uL (ref 0.9–3.3)
Monocytes %: 12.7 % — ABNORMAL HIGH (ref 1.0–11.0)
Monocytes Absolute: 0.8 10*3/uL (ref 0.0–0.8)
Neutrophils %: 52.9 % (ref 39.0–88.0)
Neutrophils Absolute: 3.4 10*3/uL (ref 2.0–8.1)

## 2022-12-28 LAB — HIV 1/2 AG/AB STAT SCREEN - ~~LOC~~: HIV-1+2 Antibodies And HIV-1 P24 Antigen Screen: NONREACTIVE

## 2022-12-28 MED ORDER — ACETAMINOPHEN 325 MG PO TABS
975.0000 mg | ORAL_TABLET | Freq: Once | ORAL | Status: DC
Start: 2022-12-28 — End: 2022-12-28

## 2022-12-28 MED ORDER — ACETAMINOPHEN 325 MG PO TABS
975.0000 mg | ORAL_TABLET | Freq: Four times a day (QID) | ORAL | Status: DC | PRN
Start: 2022-12-28 — End: 2022-12-29

## 2022-12-28 NOTE — ED Provider Notes (Signed)
CHIEF COMPLAINT:  Hematuria (X 1 day. Denies pain or burning. Denies other issues with urination. )     HISTORY OF PRESENT ILLNESS:  Interpreter used: No (English Preferred Language)    Dalton Mcintosh is a 22 year old male who presents with hematuria since yesterday night. Initially cherry red, now dark red. No hx of renal stones. Noted some blood in his underwear.   No back pain, abdominal pain, fever, chills, trauma, testicular pain, or family hx of cancer.  No new partners, no concern for STD.             PAST MEDICAL HISTORY:  Past Medical History:   Diagnosis Date    Patient denies medical problems       Patient Active Problem List    Diagnosis Date Noted    Exercise counseling 03/18/2021    Nutritional counseling 03/18/2021    Increased bilirubin level 03/18/2021     Abd U/S 03/28/21 no acute finding; possible fatty liver.       Serum calcium elevated 03/18/2021    Routine general medical examination at a health care facility 01/15/2021    Encounter to establish care 01/15/2021    Encounter for screening examination for sexually transmitted disease 01/15/2021    Encounter for hepatitis C screening test for low risk patient 01/15/2021    Marijuana use 01/15/2021    Need for vaccination 01/15/2021     SURGICAL HISTORY:  Past Surgical History:   Procedure Laterality Date    denies          ALLERGIES:  No Known Allergies      FAMILY HISTORY:  Reviewed and considered non-contributory     SOCIAL HISTORY/DETERMINANTS OF HEALTH:  none     Socioeconomic History    Marital status: Single   Tobacco Use    Smoking status: Never    Smokeless tobacco: Current   Substance and Sexual Activity    Alcohol use: Yes     Comment: 1-2 beers on weekend sometimes    Drug use: Yes     Types: Cocaine, Marijuana     Comment: weed; weekly; no longer using cocaine    Sexual activity: Yes     Partners: Female     Birth control/protection: Condom   Other Topics Concern    Special Diet No    Exercises Regularly No   Social History  Narrative    Occupation: none      VITAL SIGNS:  First Vitals [12/28/22 2021]   Temperature Heart Rate Respirations Blood pressure (BP) SpO2   99.5 F (37.5 C) 87 18 (!) 164/98 98 %     PHYSICAL EXAM:    Physical Exam  Vitals reviewed.   Constitutional:       General: He is not in acute distress.  HENT:      Head: Normocephalic and atraumatic.   Eyes:      General: No scleral icterus.     Extraocular Movements: Extraocular movements intact.   Cardiovascular:      Rate and Rhythm: Normal rate and regular rhythm.      Heart sounds: No murmur heard.     No gallop.   Pulmonary:      Effort: No respiratory distress.      Breath sounds: Normal breath sounds. No wheezing or rhonchi.   Abdominal:      General: Abdomen is flat.      Palpations: Abdomen is soft.      Tenderness: There is  no abdominal tenderness. There is no right CVA tenderness, left CVA tenderness or guarding.   Musculoskeletal:         General: No deformity.      Cervical back: Normal range of motion and neck supple.      Right lower leg: No edema.      Left lower leg: No edema.   Skin:     General: Skin is warm and dry.   Neurological:      General: No focal deficit present.      Mental Status: He is alert and oriented to person, place, and time.            MEDICAL DECISION MAKING:    Initial Impressions:  Dalton Mcintosh is a 22 year old male who presents with painless hematuria for the past two days.   Vital signs and physical exam were notable for: nontender abdomen, no CVA tenderness, no tachycardia, CTAB  Differential diagnosis includes: hematuria vs renal stone vs infected renal stone vs HIV vs gonorrhea vs chlamydia vs syphilis vs less likely trauma (patient denies) vs UTI vs doubt pyelo (no CVA tenderness) vs anemia vs doubt urinary retention vs considered bladder cancer vs considered renal cancer    Workup Review:  Pertinent Lab Results (interpreted independently by me): UA with hemoglobin and RBCs without evidence of UTI. CBC without evidence  of anemia or leukocytosis. CMP without evidence of electrolyte abnormalities or renal dysfunction.  HIV negative. Syphilis negative. GC negative.   Pertinent Imaging Results (interpreted independently by me): CTAP without renal stone or hydronephrosis.     Patient presents for painless hematuria. Exam reassuring. UA with significant hemoglobin/RBCs however not c/w infection. CTAP without significant findings. Less likely cancer due to young age and no pain or B symptoms. Discussed need for close f/u and gave urology and PCP referral. Return precautions given.     Disposition Decision:    Discharge                                                 Patient's exam and workup were reassuring. They are stable for discharge at this time and I recommend that they pursue outpatient follow up for their complaint. Questions answered and return precautions provided.     Discharge Medication List as of 12/29/2022  1:18 AM        Discharge Procedure Orders   Consult/Referral to Urology   Referral Priority: Routine Referral Type: Physician   Referral Reason: Consult W/ Recommendation   Requested Specialty: Urology   Number of Visits Requested: 1 Expiration Date: 06/29/23     Follow Up Appointment Post Discharge   Referral Priority: Routine Referral Type: Physician   Referral Reason: Consult W/ Recommendation   Number of Visits Requested: 1                 The following work up was performed during the encounter. Orders placed after a disposition has been selected will not be listed here. A complete account of orders should be referenced elsewhere:     ED Orders (From admission, onward)      Ordered     Status Ordering Provider    12/28/22 2024  Hemoglobin, Hemocue Point of Care Testing  PROCEDURE ONCE         Final result Alannah Averhart PATRICK  12/29/22 0000  Other Imaging Result  ONE TIME         Final result EPIC ELECTRONIC INTERFACE    12/28/22 2129  CT Abdomen And Pelvis W/O Contrast  ONE TIME         Final result Vira Agar MARIE    12/28/22 2119  US Renal/Aortic Limited Images - FUJI  ONE TIME         Final result Schelly Chuba PATRICK    12/28/22 2116  Chlamydia trachomatis and Neisseria gonorrhoeae, PCR  ONCE         Final result SIRACO, SUSAN MARIE    12/28/22 2116  CBC w/ Diff  ONCE         Final result Vira Agar MARIE    12/28/22 2116  Comprehensive Metabolic Panel  ONCE         Final result Vira Agar MARIE    12/28/22 2116  Urinalysis With Culture Reflex, When Indicated  ONCE         Final result Vira Agar MARIE    12/28/22 2116  Syphilis Antibody with Reflex Testing  ONCE         Final result SIRACO, SUSAN MARIE    12/28/22 2116  HIV 1 and 2 Combo Antigen and Antibody with Reflex to HIV-1/HIV-2 Antibody Differentiation, STAT  ONCE         Final result Vira Agar MARIE    12/28/22 2116  Hemogram  PROCEDURE ONCE         Final result Vira Agar MARIE    12/28/22 2116  Differential  PROCEDURE ONCE         Final result SIRACO, SUSAN MARIE             ED COURSE/PATIENT REASSESSMENTS:  "ED Course" is listed below that provides real time documentation during ER encounter and will provide further context to the MDM discussion above.    Workup Summary         Value Comment By Time      Ua not c/w UTI Nettie Elm Lindon Romp, MD 11/25 2158      CBC without evidence of anemia or leukocytosis. CMP without evidence of electrolyte abnormalities or renal dysfunction.  HIV negative.  Earlie Counts, MD 11/25 2344      [facility] CT ABDOMEN & PELVIS Without Contrast:    No acute findings within the abdomen or pelvis to explain the patient's symptoms.    No radiopaque obstructing renal calculi, hydronephrosis or perinephric fluid bilaterally.    The urinary bladder is minimally distended with fluid. No signs for acute urinary bladder cystitis.    Earlie Counts, MD 11/26 [phone]              DIAGNOSIS:    ICD-10-CM ICD-9-CM   1. Hematuria, unspecified type  R31.9 599.70   2. Does not have primary care provider  Z75.8  V49.89                           Siraco, Lindon Romp, MD  Resident  12/30/22 [phone]    ATTENDING ATTESTATION:  I evaluated the patient concurrently with the Resident/Fellow.  I discussed the case with the Resident/Fellow and agree with the findings and plan as documented by the Resident/Fellow.  Any additions or revisions are included in the record as necessary.    Park Pope, MD        Park Pope, MD  01/05/23 0955

## 2022-12-29 LAB — C. TRACHOMATIS + N. GONORRHOEAE, PCR - ~~LOC~~
Chlamydia trachomatis DNA: NOT DETECTED
Neisseria gonorrhoeae DNA: NOT DETECTED

## 2022-12-29 LAB — SYPHILIS AB WITH REFLEX TESTING, BLOOD - ~~LOC~~: T. Pallidum Antibody: NONREACTIVE

## 2022-12-29 NOTE — Discharge Instructions (Addendum)
Please follow-up with your primary care provider in 1-3 days for repeat evaluation and, if indicated, further management of your symptoms.    Follow up with urology.  This may have been a small stone, but no stone or swollen kidneys noted on CT scan.     Return to the emergency department IMMEDIATELY for fever, vomiting, chest pain, shortness of breath, pain with urination, one sided back pain, or anything else that concerns you.      [facility]: Appointments - [facility]    To schedule an initial or follow-up appointment with the physicians at our [facility] offices, just call [phone] and we will assist you through the process.    The [facility] medical offices are located at:  [facility]  [address]  [place], [place] [address]    Please note: For [facility] clinics and physicians other than the emergency department, your health insurance provider may or may not require prior authorization before an appointment at [facility] can be made on your behalf. If you are not sure whether or not your health insurance company requires prior authorization, please call your health insurance provider first to prevent any unnecessary delays in your care.      [facility]: Appointments [facility] for Urologic Care    To schedule an initial or follow-up appointment with the [facility] Urologists, just call [phone] and we will assist you through the process.    The [facility] urology offices are located at:    [facility] - Urology Offices  [address]  [place], [place] [address]    Please note: For [facility] clinics and physicians other than the emergency department, your health insurance provider may or may not require prior authorization before an appointment at [facility] can be made on your behalf. If you are not sure whether or not your health insurance company requires prior authorization, please call your health insurance provider first to prevent any unnecessary delays in your  care.

## 2022-12-31 LAB — HEMOGLOBIN, HEMOCUE POINT OF CARE TESTING - ~~LOC~~: Hem, Hemocue POC: 14.6 g/dL (ref 7.0–21.0)

## 2023-05-03 ENCOUNTER — Ambulatory Visit: Payer: No Typology Code available for payment source | Admitting: Urology

## 2023-07-05 NOTE — Progress Notes (Signed)
 [place]  [employer] [facility]     Dorthea Alvia Camp, [facility]  Certified Nurse Practitioner  [place] , [facility]  [address] [place], [address]  [place], [place]  [address]  P: [phone]    Thank you for the referral. Please see my detailed evaluation below.     Sincerely,     Dorthea Alvia Camp, FNP-C    ===================================================  Consultation requested by Dr. Goubert for an opinion regarding microscopic hematuria, and my final recommendations will be communicated back to the requesting provider by way of shared Medical record, fax or letter via US  mail.    CHIEF COMPLAINT: microscopic hematuria    HPI: Dalton Mcintosh is a 23 year old male with no significant medical history, who presents for evaluation of microscopic hematuria and gross hematuria.    Patient reports had gross hematuria last November (last for 2 days), no dysuria, gross hematuria, fever or chills, flank pain, or NV, went to ED and had normal CT scan and urine test. Sent home without any treatment.  Reports no more gross blood since then  Denies history of kidney stone or bladder stone.  Denies history of rUTI  Denies personal or family history of bladder cancer, kidney cancer, ureteral cancer, or prostate cancer  History of smoke: never  Occupation: work in Naval [employer]    Denies any urological symptoms.    Urinary symptoms:  He denies urinary urgency.  He denies daytime frequency.   He denies voiding dysfunction or sensation of incomplete bladder emptying.  He denies nocturia.   He denies nocturnal enuresis.  He denies urgency urinary incontinence (UUI).   He denies stress urinary incontinence (SUI).  He denies prior GU surgeries.  He denies prior GU malignancy    UTI symptoms:  He denies dysuria or gross hematuria .   He denies recurrent UTI.     Bowel symptoms:  He reports regular bowel movement.   He describes her bowel movements as normal.  He has bowel movements  without straining.  He denies fecal incontinence.    Sexual function:  He reports sexually active.   He denies abnormal STI test.    Neurologic history:  No hx of spinal cord injury, known neurologic disease, stroke.     REVIEW OF SYSTEMS:  No acute changes in the review of system from baseline, other than the symptoms reported in the HPI    Past Medical History:   Diagnosis Date    Patient denies medical problems       Past Surgical History:   Procedure Laterality Date    denies           Family History   Problem Relation Name Age of Onset    No Known Problems Mother      No Known Problems Sister two sisters      none     Socioeconomic History    Marital status: Single   Tobacco Use    Smoking status: Never    Smokeless tobacco: Current   Substance and Sexual Activity    Alcohol use: Yes     Comment: 1-2 beers on weekend sometimes    Drug use: Yes     Types: Cocaine, Marijuana     Comment: weed; weekly; no longer using cocaine    Sexual activity: Yes     Partners: Female     Birth control/protection: Condom   Other Topics Concern    Special Diet No  Exercises Regularly No   Social History Narrative    Occupation: none         Patient has no known allergies.    A chaperone was offered and the patient declined.  An interpreter was offered and the patient declined.    PHYSICAL EXAM:  BP 138/77   Pulse 73   Gen: NAD, A&O x 3  Psych: normal affect  Skin: Warm and dry. No lesions or rashes in the areas observed  Pulm: no respiratory distress on room air  CV: no edema in bilateral extremities  ABD: Soft, non-tender to touch.  Extremities: no cyanosis, clubbing or edema noted    LABS/PROCEDURES:  12/27/2022 UA/Micro showed >182 RBCs    PVR of 0 ml obtained by bladder scan.    RADIOLOGY STUDIES:  12/28/2022 ctap W/O CONTRAST  FINDINGS:   Evaluation of solid organs, perfusion, and isodense lesions is limited without intravenous contrast. The following findings are reported within this limitation.     Lower chest:  Unremarkable.     Liver: Unremarkable.     Gallbladder and bile ducts: Unremarkable.     Spleen: Unremarkable.     Pancreas: Unremarkable.     Adrenals: Unremarkable.     Kidneys and ureters: Unremarkable.     Bowel: Nondilated bowel with no wall thickening. Normal appendix.     Bladder: Unremarkable for the degree of underdistention.     Reproductive Organs: Unremarkable.     Lymph nodes: Unremarkable.     Peritoneum: Unremarkable.     Vasculature: Unremarkable.     Abdominal wall: Unremarkable.     Bones: Unremarkable.     IMPRESSION:  1.  No acute process of the abdomen or pelvis.    ASSESSMENT:  Dalton Mcintosh is a 23 year old yo with no significant medical history, who presents for evaluation of microscopic hematuria and gross hematuria.    ICD-10-CM ICD-9-CM   1. Gross hematuria  R31.0 599.71   2. Microscopic hematuria  R31.29 599.72       PLAN:    # Gross and Microscopic hematuria  -Asymptomatic  -UA: >182 rbc/hpf  -Discussed consider cystoscopy and upper urinary tract imaging if there are more 3 RBCs seen in the urine test.  -Discussed patient should repeat evaluation within three to five years if persistent or recurrent asymptomatic microhematuria after initial negative urologic work-up.  -Discussed that this evaluation is tailored based on risk factors, age, smoking status and additional risk factors.  Low risk (age < 50, <10 year smoking history, 3-10 RBC/HPF) -> Cystoscopy and renal ultrasound vs. Repeat UA/Microscopic and urine cytology.   Intermediate risk (age 21-59, 10-30 year smoking history, 11-25 RBC/HPF on UA) -> Cystoscopy and renal ultrasound.   High risk (age > 32, >30 years smoking history, >25 RBC/HPF) -> Cystoscopy and CT Urogram.   -The patient is at high risk.  -Plan for UA/Microscopic, urine cytology, BMP prior to CTU, cystoscopy      DISPO:  - f/u for cystoscopy  - Baseline urine studies    A total of 30 minutes were spent on this visit, of which over 50% was spent in counseling and  coordination of care. which includes some or all of the following components: face-to-face time, non-face-to-face time, preparation to see the patient, review of tests, obtaining and or reviewing separately obtained history performing a medically appropriate examination and/or evaluation, counseling and educating the patient/family/caregiver, ordering medications, tests, or procedures, referring and communicating with other healthcare professionals, documenting  clinical information in the EHR and independently interpreting results and communicating results to patient.    This visit is of  high complexity secondary to multiple pre-existing and possible diagnoses, and extensive data review required to provide comprehensive care and management planning    I attest that I have advised the patient that I am not a physician or surgeon and that I am a nurse practitioner. I have also advised the patient that they may request and will be allowed to see a physician. In addition, I have advised the patient that in the event of any change in their condition outside of my education and training, they will be referred to a physician.     Niyati Heinke Cam-Phuoc Alvia Camp, NP  Certified Nurse Practitioner

## 2023-07-05 NOTE — Patient Instructions (Addendum)
 Cystoscopy  Cystoscopy is a procedure that lets your doctor look directly inside your urethra and bladder. It can be used to:  Help diagnose a problem with your urethra, bladder, or kidneys.  Take a sample (biopsy) of bladder or urethral tissue.  Treat certain problems (such as removing kidney stones).  Place a stent to bypass an obstruction.  Take special X-rays of the kidneys.  Based on the findings, your doctor may recommend other tests or treatments.    What is a cystoscope?  A cystoscope is a telescope-like instrument that contains lenses and fiberoptics (small glass wires that make bright light). The cystoscope may be straight and rigid, or flexible to bend around curves in the urethra. The doctor may look directly into the cystoscope, or project the image onto a monitor.    The procedure we perform in the office utilizes a flexible cystoscopy and a imaging monitor, visible to everyone in the procedure room.     Getting ready  Ask your doctor if you should stop taking any medications prior to the procedure.  Ask whether you should avoid eating or drinking anything after midnight before the procedure.  Follow any other instructions your doctor gives you.  Tell your doctor before the exam if you:  Take any medications, such as aspirin or blood thinners (these most often do not need to be stopped for office based procedure without biopsy)  Have allergies to any medications  Are pregnant    The procedure  Cystoscopy is done in the doctor's office or hospital. The doctor and a nurse are present during the procedure. It takes only a few minutes, longer if a biopsy, X-ray, or treatment needs to be done.  During the procedure:  You lie on an exam table on your back, knees bent and legs apart. You are covered with a drape.  Your urethra and the area around it are washed. Anesthetic jelly may be applied to numb the urethra. Other pain medication is usually not needed. In some cases, you may be offered a mild sedative to  help you relax. If a more extensive procedure is to be done, such as a biopsy or kidney stone removal, general anesthesia may be needed.  The cystoscope is inserted. A sterile fluid is put into the bladder to expand it. You may feel pressure from this fluid.  When the procedure is done, the cystoscope is removed.  After the procedure  If you had a sedative, general anesthesia, or spinal anesthesia, you must have someone drive you home. Once you're home:  Drink plenty of fluids.  You may have burning or light bleeding when you urinate--this is normal.  Medications may be prescribed to ease any discomfort or prevent infection. Take these as directed.  Call your doctor if you have heavy bleeding or blood clots, burning that lasts more than a day, a fever over 100F  (38 C), or trouble urinating.   2000-2016 [employer] [address], [place], [place] [address]. All rights reserved. This information is not intended as a substitute for professional medical care. Always follow your healthcare professional's instructions.      [facility] Locations     We are contracted with major PPO insurance plans, Medicare and Medi-Cal. Please check with your insurance before receiving laboratory services.  [facility] laboratory services can be found at convenient locations throughout [facility].      Laboratory Patient Service Centers  [facility] Laboratory Services provides convenient patient service  center locations   with prompt and courteous service.    [facility]   [address] [employer]  [address]  [place], [place] [address]   Phone: [phone]  Fax: [phone] Hours: Monday-Friday   6:30 am - 5 pm  Saturday, 7 am - noon   Park in the parking structure on [employer] [address]       [facility]  [address]  [place], [place] [address]  Phone: [phone]  Fax: [phone] Hours: Monday-Friday 7:30 am - 4 pm         [facility]  [address]  [place], [place] [address]  Phone: [phone]  Fax: [phone] Hours: Monday-Friday 7:30 am - 5 pm       [facility]  [address] [place], [place] [address]  Phone: [phone]  Fax: [phone] Hours: Monday-Friday 8 a.m.-4 p.m. Closed for lunch from noon-1 p.m       [facility]  [address]  [place], [place] [address]  Phone: [phone]  Fax: [phone] Hours: Monday-Friday 7:30 am - 4 pm  Closed for lunch from 12:30 - 1:30 pm       [facility]   [phone] [address]   [place], [place] [address]  Phone: [phone]   Fax: [phone] Hours: Monday-Friday 7:30 am - 4 pm          [facility]  [address], [address]  [place], [place] [address]  Phone: [phone]  Fax: [phone] Hours: Monday-Friday 7:30 am - 4 pm       [facility] is contracted with major PPO insurance plans, Medicare and Medi-Cal.    Proof of insurance or payment (by check, credit card, or cash) due at time of service.   For additional information, please visit our online patient service center listings at [email or web address].     To schedule an appointment at a [facility] lab location go to the following URL or scan the QR Code: [email or web address]         Your doctor has ordered you to have a ct scan.    Authorization or Pre-Certification may be required for the diagnostic study.  For questions and/or concerns regarding this process, you may reach out to the Central Authorization Department at [phone].      Please obtain images on CD for any imaging performed outside of [place].      Radiology Services  Our Radiology/Imaging services at [facility] play a critical role in diagnosing and helping treat a wide range of conditions, with expertise in women's and abdominal imaging.  They are skilled in providing a host of interventional radiology  services, including chemoembolization and cryoablation of tumors.    [facility] Available Services:   [address] [place], [place] [address]  Phone: [phone]  Hours: Monday-Friday 8am-5pm    Limited weekend services:  Computed Tomography (CT scan)  Magnetic Resonance Imaging (MRI)  Ultrasound  Saturday-Sunday 7:30am-4:30pm Bone density scans (DXA scans)   Breast ultrasound   Computed Tomography (CT Scan)  Interventional radiology   Magnetic Resonance Imaging (MRI)  Nuclear/Molecular medicine (NM)  Positron emission tomography -        computed tomography (PET CT)  Tomosynthesis (3D Mammography) and image-guided procedures  Ultrasound   X-ray exams       [place] Available Services:   [facility]  [address]  [place], [place] [address]  Phone: [phone]  Hours: Monday-Friday 8am - 4:30pm *  Closed from 12pm to 1pm for lunch      *Diagnostic x-ray and ultrasound will be available to accommodate Urgent Care hours (8am-8pm). Bone Density Scans (DXA scans)  Ultrasound  X-ray exams       [place] Available Services:   [facility]  [address]  [place], [place] [address]  Phone: [phone]  Hours: Monday-Friday 8am - 4:30pm *    *Diagnostic x-ray and ultrasound are available 7am-7pm. Computed Tomography (CT Scan)  Magnetic Resonance Imaging (MRI)  Nuclear/molecular medicine (NM)  Ultrasound   X-ray exams  Tomosynthesis (3D Mammography) and image-guided procedures         [facility] Available Services:   [facility]   [facility]  [address]  [place], [place] [address]  Phone: [phone]  Hours: Monday-Friday 8am - 5pm Breast ultrasound and breast ultrasound-guided procedures  MRI and breast MRI-guided procedures  Tomosynthesis (3D mammography) and image-guided procedures       Jan[facility] Parma Available Services:   [facility]  [address]  [place], [place] [address]  Phone:  [phone]  Hours: Monday-Friday 8am - 5pm. Closed from 12pm to 1pm for lunch.   Ultrasound  PETCT  MRI  [place] Available Services:   [facility]   [address]  [place], [place] [address]  Phone: [phone]  Hours: Monday-Friday  8am - 5pm Closed from 12pm to 1pm for lunch. Magnetic Resonance Imaging (MRI)       [employer] Available Services:   [facility]  [address]  [place], [place] [address]  Phone: [phone]  Hours: Monday-Friday 8am-5pm Closed from 12pm to 1pm for lunch.    After hours walk-in (X-rays):  Monday-Friday 5-9pm  Saturday-Sunday 9am-5pm Ultrasound  X-ray exams       [facility] Available Services:   [facility]  [address]  [place], [place] [address]  Phone: [phone]  Hours: Monday-Friday  8am - 5pm Closed from 12pm to 1pm for lunch.    Limited nights and weekend services:  Monday-Friday 5pm - 7pm (Ultrasound and MRI)  Saturday 8am-5pm (Ultrasound and MRI) Breast ultrasound and breast ultrasound guided procedures  MRI and MRI guided procedures  Tomosynthesis (3D Mammography)  Ultrasound  X-ray exams        Make an appointment  Call or visit us  on our online portals to schedule an imaging test or procedure at [facility].      Radiology services  (MRI, CT, Ultrasound, Mammography, Nuclear Medicine, Bone Density, Fluoroscopy)  Phone: [phone]  Fax: [phone]    Online: Complete appointment request form at   [email or web address]  Request an appointment:     Interventional Radiology Services  Phone: [phone]  Fax: [phone]  Monday-Friday  8am - 5pm       MyChart Patient Portal   Health patients can use MyChart to request appointments for the following: Bone Density, CT, Screening Mammogram and  Ultrasound exams. If you have any issues, call [facility] MyChart patient support at [phone].      Walk-in appointments   *X-Rays do not require an  appointment and are performed on a walk-in basis at [facility], [facility], [facility] and [place]. [employer] patients may only schedule an X-ray at [facility] in [place]        Get My Results   Radiology Imaging Patient Portal  The Radiology Imaging Patient Portal serves as a secure and digital platform designed specifically for existing [facility] patients who have undergone imaging procedures at [facility]. This portal provides convenient access for patients to securely retrieve and manage their medical records and images.      To access the Radiology Imaging Patient Portal through MyChart, follow these steps:   Access your MyChart account and navigate to Test Results    2.  Select Linked Information    3.  Gain access to a list of exams conducted over the last four years.        Walk-in appointments   *X-Rays do not require an appointment and are performed on a walk-in basis at [facility], [facility], [facility] and [place]. [employer] patients may only schedule an X-ray at [facility] in [place]        Get My Results   Radiology Imaging Patient Portal  The Radiology Imaging Patient Portal serves as a secure and digital platform designed specifically for existing [facility] patients who have undergone imaging procedures at [facility] [address]. This portal provides convenient access for patients to securely retrieve and manage their medical records and images.      To access the Radiology Imaging Patient Portal through MyChart, follow these steps:   Access your MyChart account and navigate to Test Results    2.  Select Linked Information    3.  Gain access to a list of exams conducted over the last four years.

## 2023-07-06 ENCOUNTER — Ambulatory Visit

## 2023-07-06 ENCOUNTER — Telehealth: Payer: Self-pay

## 2023-07-06 VITALS — BP 138/77 | HR 73

## 2023-07-06 DIAGNOSIS — R310 Gross hematuria: Secondary | ICD-10-CM | POA: Insufficient documentation

## 2023-07-06 DIAGNOSIS — D303 Benign neoplasm of bladder: Secondary | ICD-10-CM

## 2023-07-06 DIAGNOSIS — R3129 Other microscopic hematuria: Secondary | ICD-10-CM | POA: Insufficient documentation

## 2023-07-06 NOTE — Telephone Encounter (Signed)
 Lvm for patient advising to [facility]. We collected urine culture and cytology today but not enough urine to send out Urinalysis. Advised patient to call back if he has questions or concerns.

## 2023-07-07 ENCOUNTER — Telehealth: Payer: Self-pay | Admitting: Urology

## 2023-07-07 NOTE — Telephone Encounter (Signed)
 Lvm for patient regarding appointment time for cystoscopy. Needs to move appointment to 10am. Advised to call back to confirm or if he needs to r/s.

## 2023-07-08 LAB — URINE CULTURE - ~~LOC~~

## 2023-07-12 LAB — CYTOLOGY NON GYN - ~~LOC~~

## 2023-07-19 IMAGING — MR RM JOELHO ESQUERDO
4 of 6 series · 22 of 40 positions shown · non-contrast
Comparison: none

[Series 1: loc 3 planos · axial · 8.0mm · 1.17mm/px · z∈[-10,+149]mm · 4 of 9 slices shown]
[im 1/9]
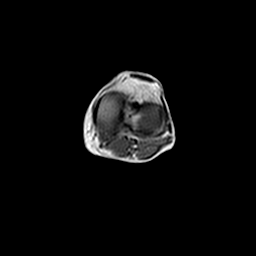
[im 3/9]
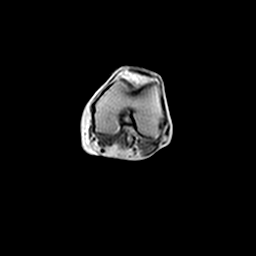
[im 6/9]
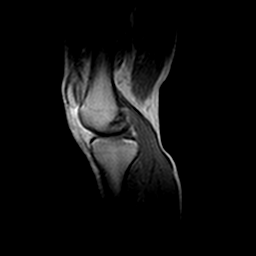
[im 9/9]
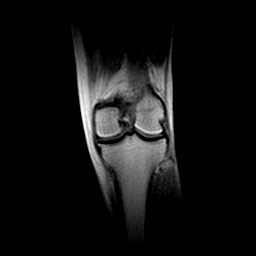

[Series 2: STIR · axial · 4.5mm · 0.49mm/px · z∈[-46,+62]mm · 9 of 20 slices shown (1 of 2)]
[im 1/20]
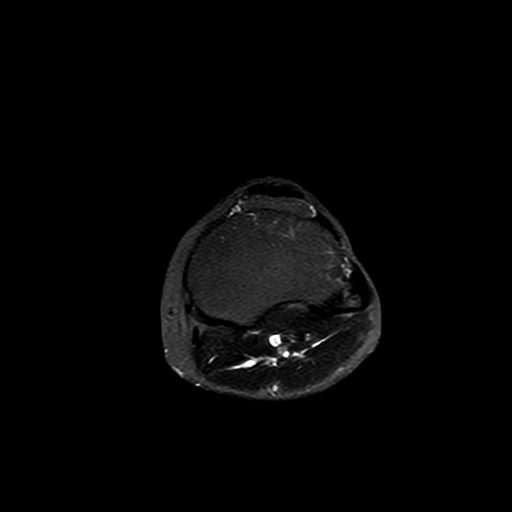
[im 3/20]
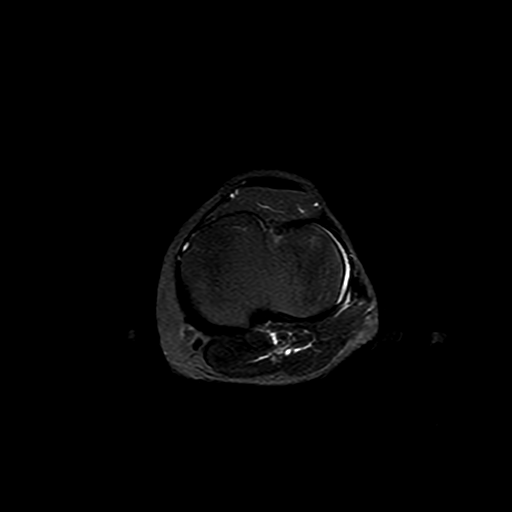
[im 5/20]
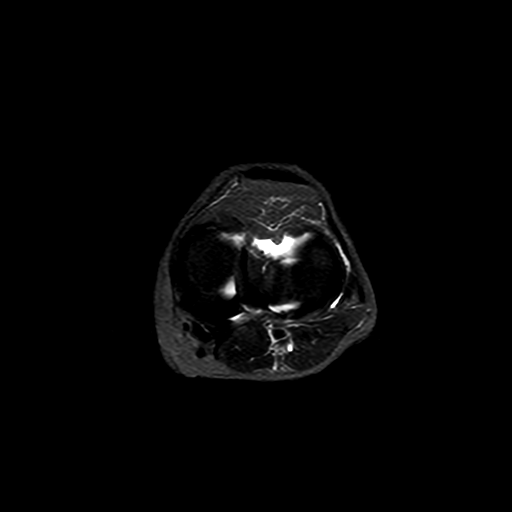
[im 8/20]
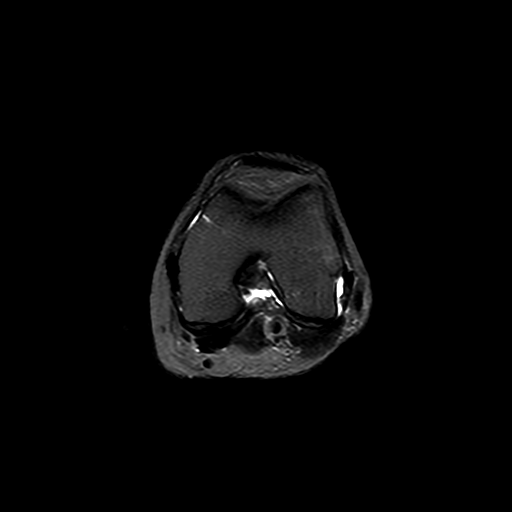
[im 10/20]
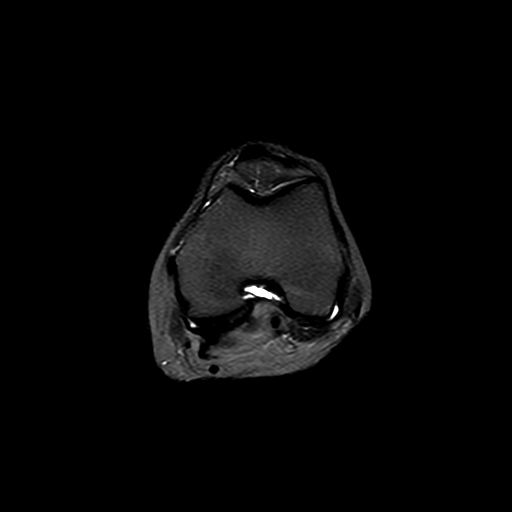
[im 12/20]
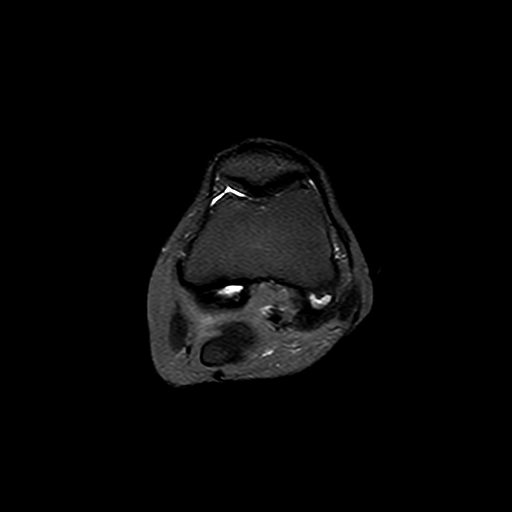
[im 15/20]
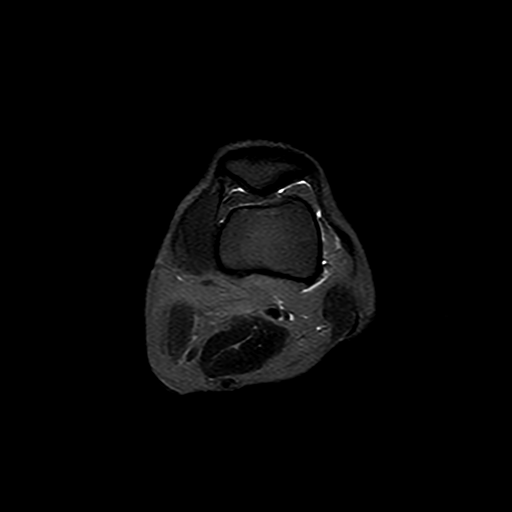
[im 17/20]
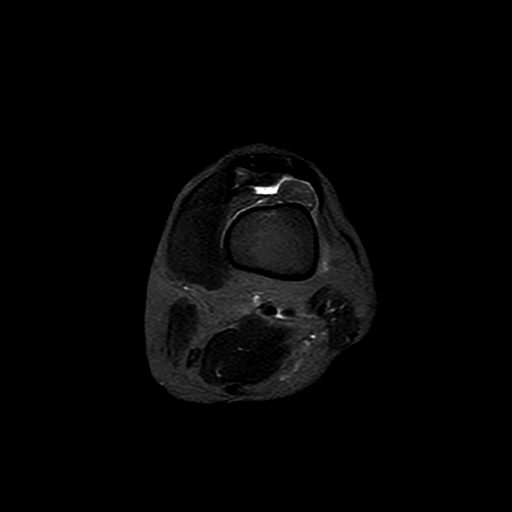
[im 20/20]
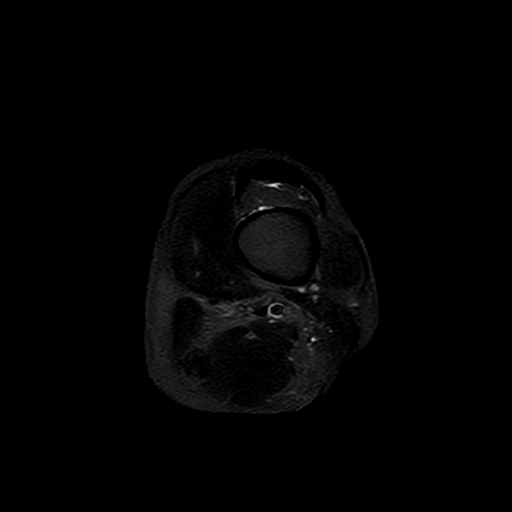

[Series 3: STIR · sagittal · 4.3mm · 0.45mm/px · 6 of 18 slices shown (2 of 2)]
[im 1/18]
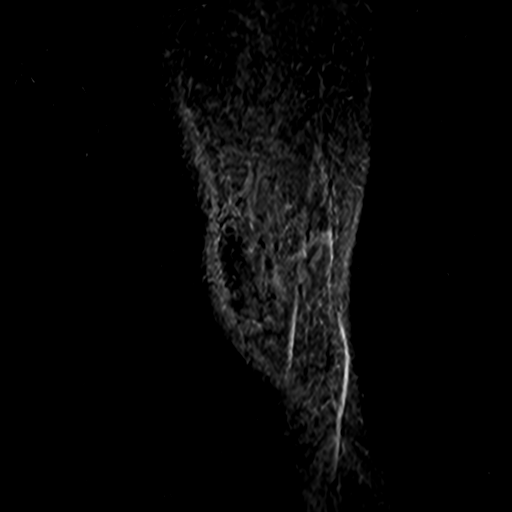
[im 3/18]
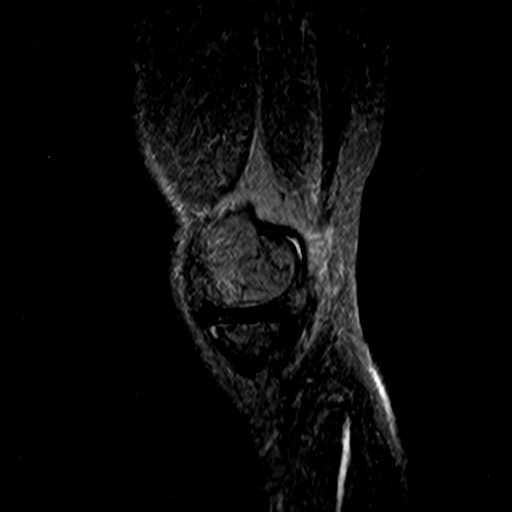
[im 5/18]
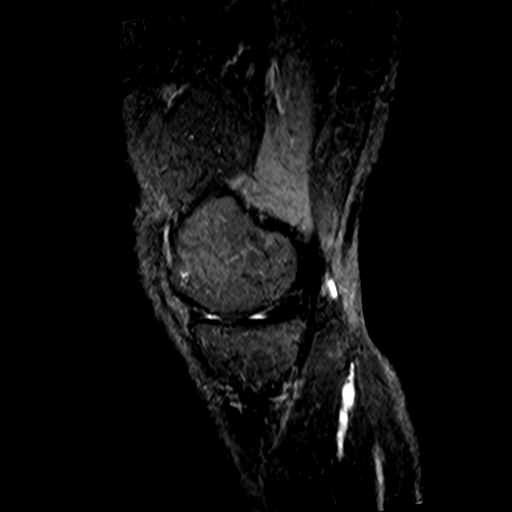
[im 8/18]
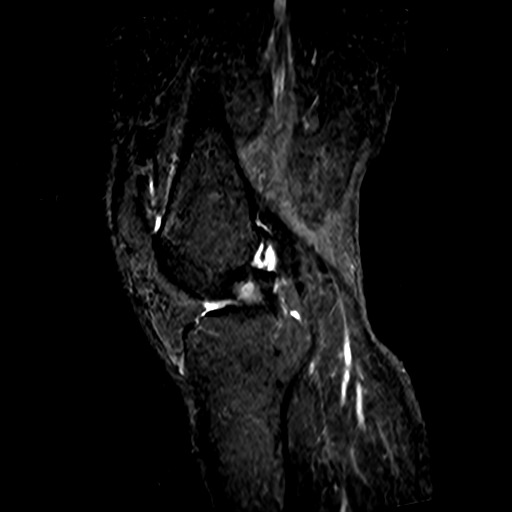
[im 10/18]
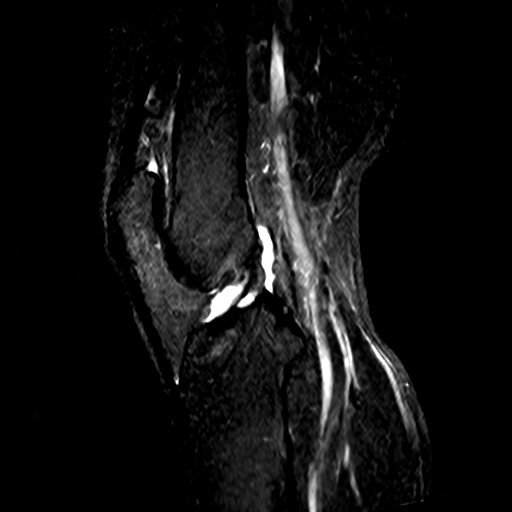
[im 15/18]
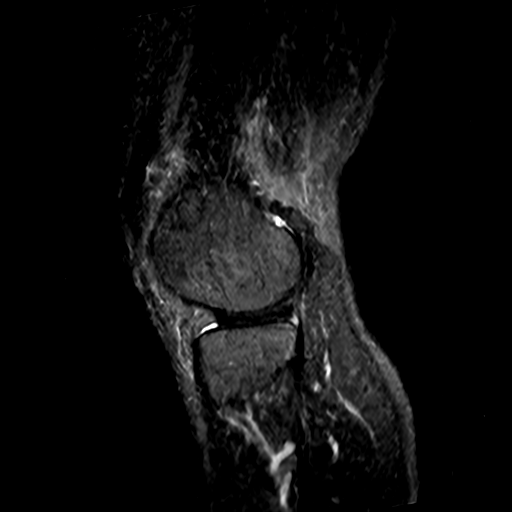

[Series 4: sag t1_smmk_recon · sagittal · 4.3mm · 0.45mm/px · 3 of 18 slices shown]
[im 3/18]
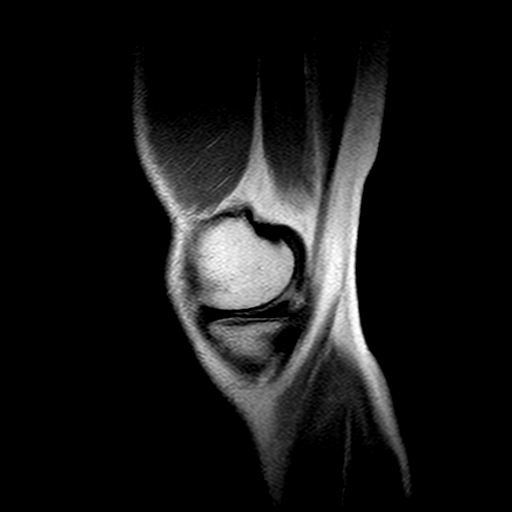
[im 10/18]
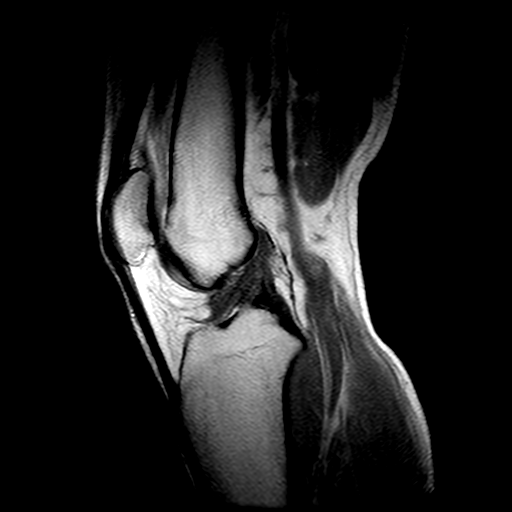
[im 15/18]
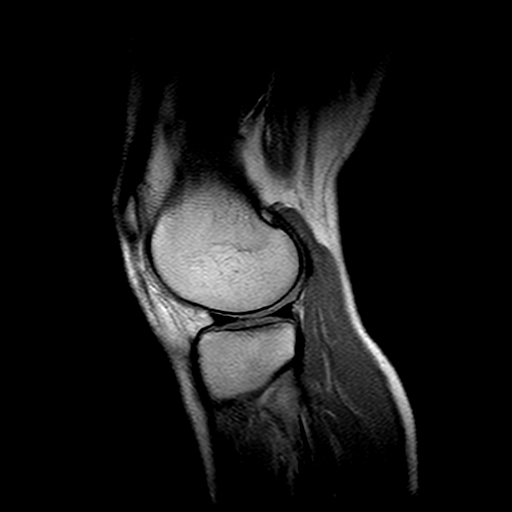

[22 of 40 positions shown; findings below may reference images not displayed]

RESSONÂNCIA MAGNÉTICA DO JOELHO ESQUERDO
INDICAÇÃO CLÍNICA:
Suspeita de condropatia femoral
TÉCNICA:
Ressonância magnética do joelho esquerdo realizada sem contraste, com cortes multiplanares nos planos axial, sagital e coronal, em sequências T1, T2 e DP com supressão de gordura.
RESULTADO:
Côndilos femorais, tíbia e patela com morfologia e sinal normais.
Cartilagens articulares das superfícies femorotibiais e patelofemoral com espessura e sinal preservados, sem alterações condrais visíveis.
Meniscos medial e lateral com morfologia e sinal normais, sem evidência de roturas ou extrusões.
Ligamentos cruzado anterior e posterior íntegros. Ligamentos colaterais medial e lateral com morfologia preservada.
Tendões do quadríceps e patelar com espessura e sinal normais.
Não há derrame articular ou distensão de bursas.
CONCLUSÃO:
Exame sem alterações significativas.

## 2023-07-19 IMAGING — MR RM JOELHO DIREITO
4 of 5 series · 22 of 40 positions shown · non-contrast
Comparison: none

[Series 1: loc 3 planos · axial · 8.0mm · 1.17mm/px · z∈[-10,+149]mm · 4 of 9 slices shown]
[im 1/9]
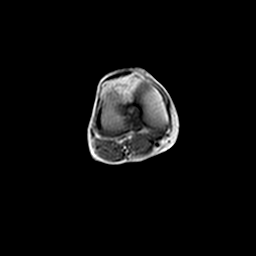
[im 3/9]
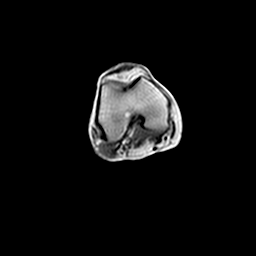
[im 6/9]
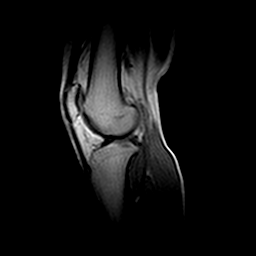
[im 9/9]
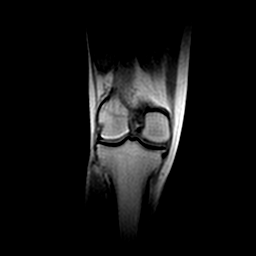

[Series 2: STIR · axial · 4.5mm · 0.49mm/px · z∈[-31,+76]mm · 11 of 20 slices shown (1 of 2)]
[im 1/20]
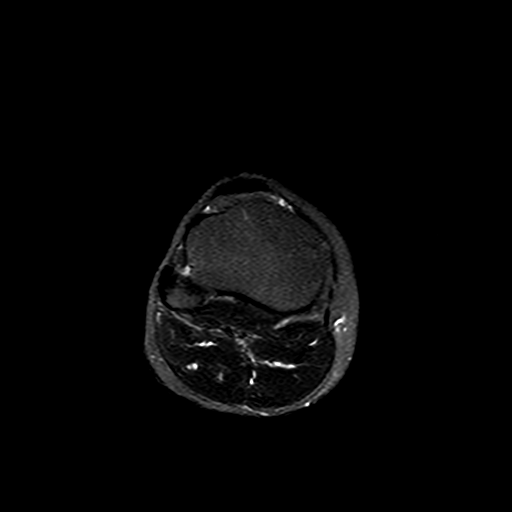
[im 2/20]
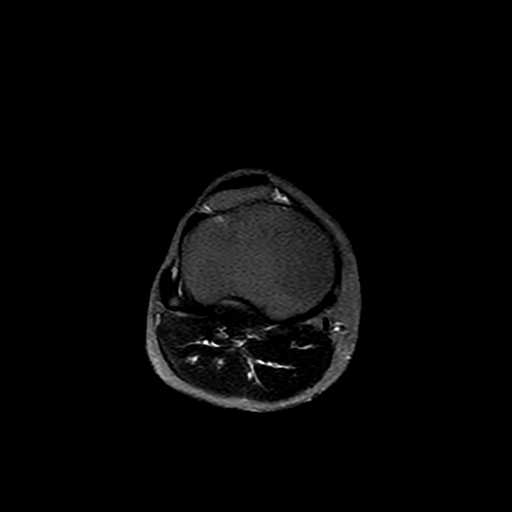
[im 4/20]
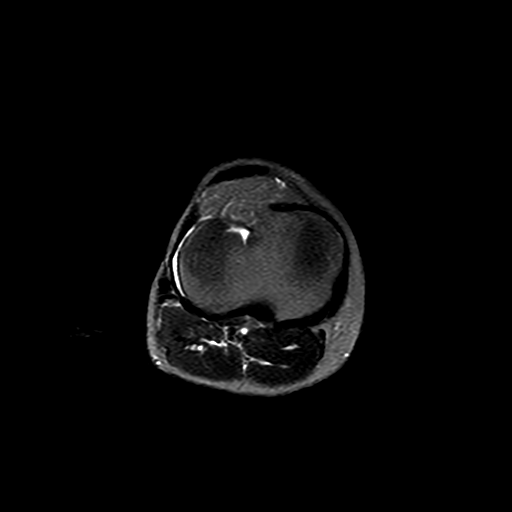
[im 6/20]
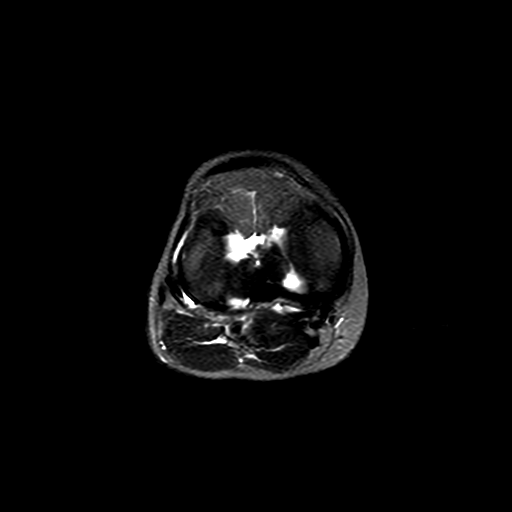
[im 8/20]
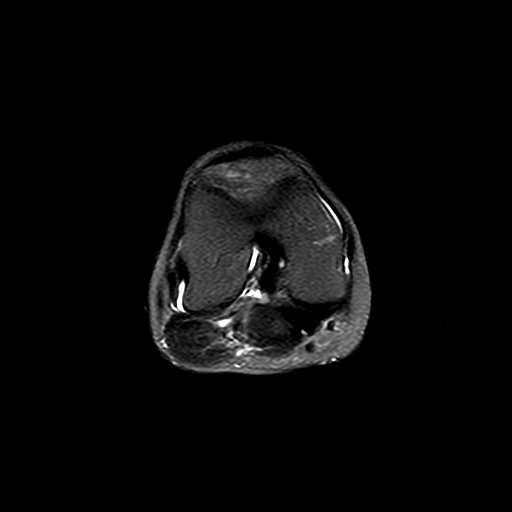
[im 10/20]
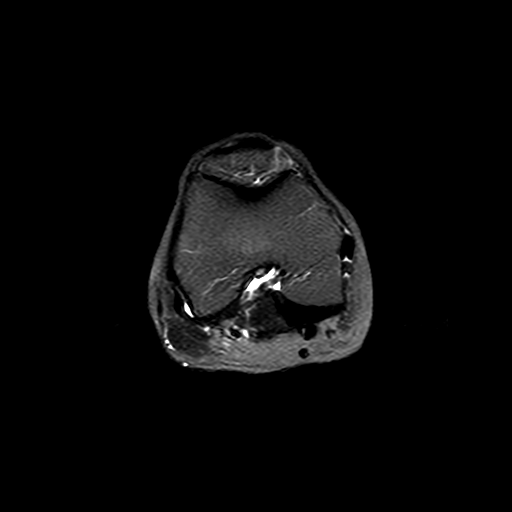
[im 12/20]
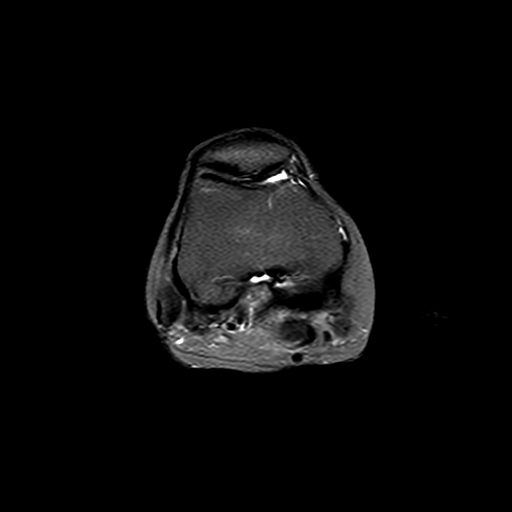
[im 14/20]
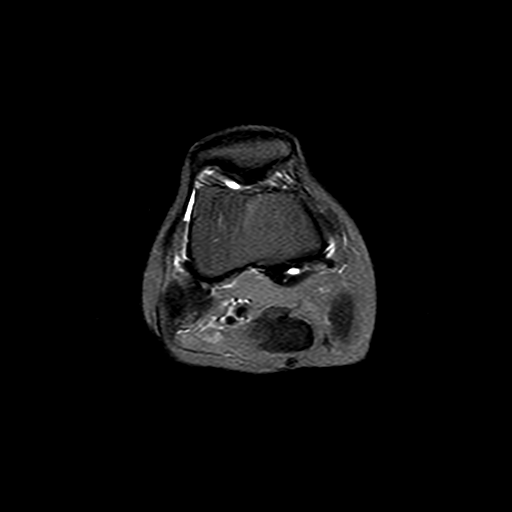
[im 16/20]
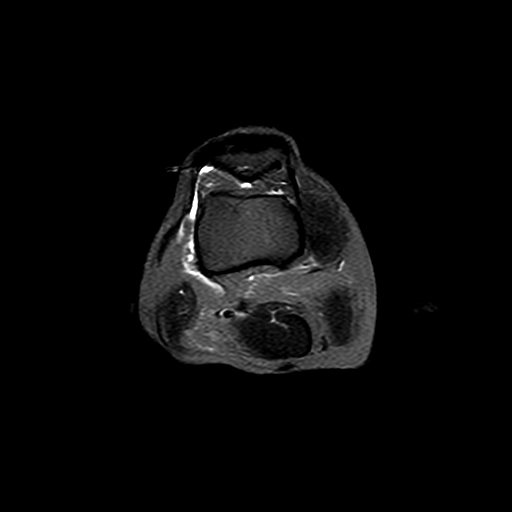
[im 18/20]
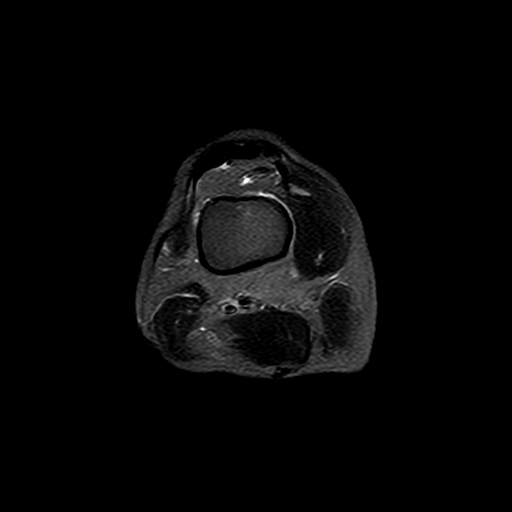
[im 20/20]
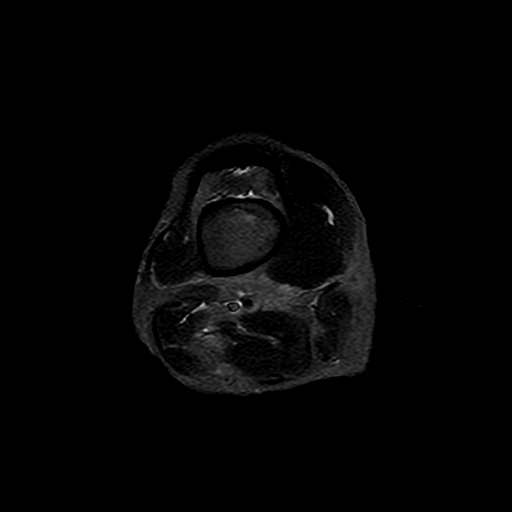

[Series 3: STIR · sagittal · 4.3mm · 0.43mm/px · 4 of 18 slices shown (2 of 2)]
[im 1/18]
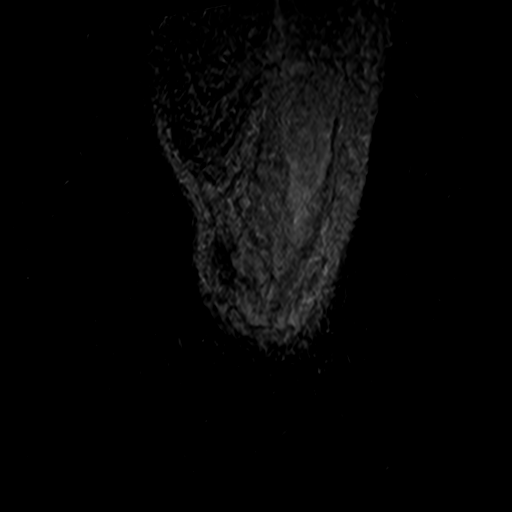
[im 2/18]
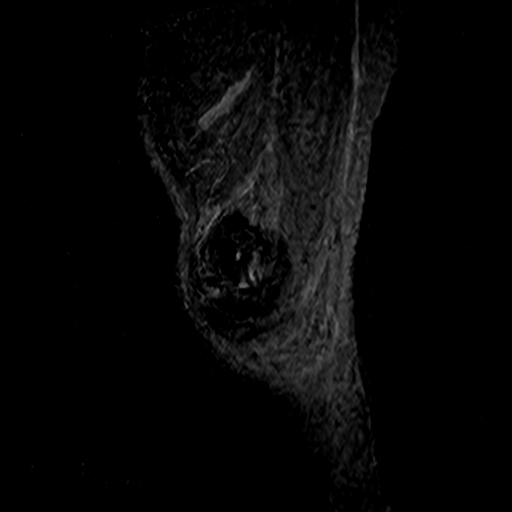
[im 10/18]
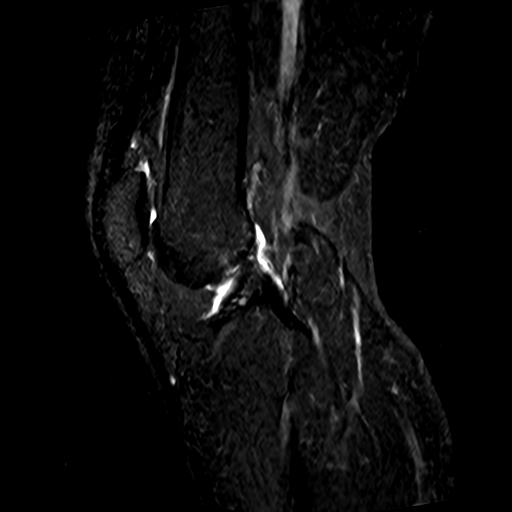
[im 16/18]
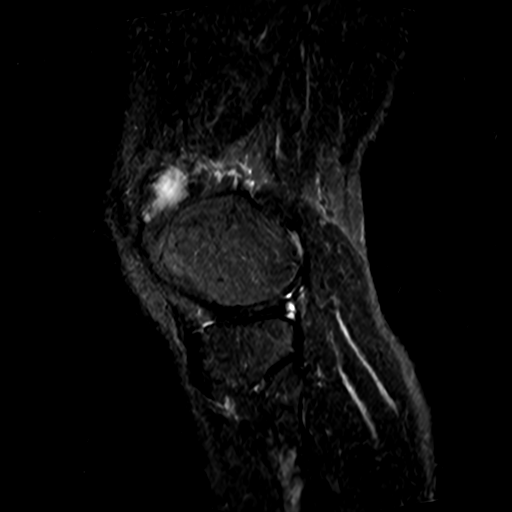

[Series 4: sag t1_smmk_recon · sagittal · 4.3mm · 0.43mm/px · 3 of 18 slices shown]
[im 2/18]
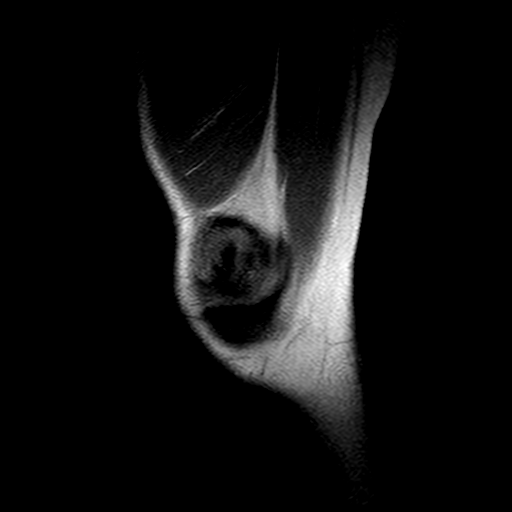
[im 10/18]
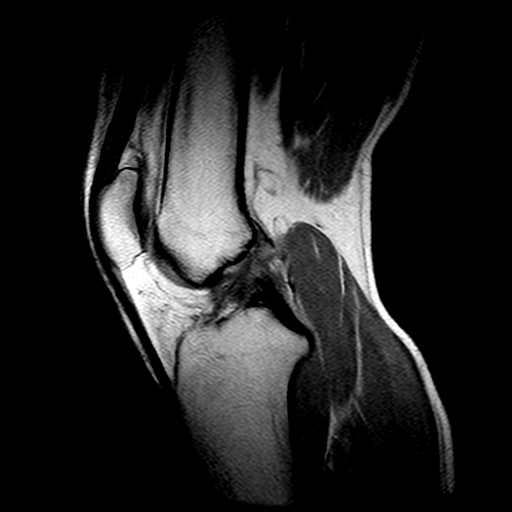
[im 16/18]
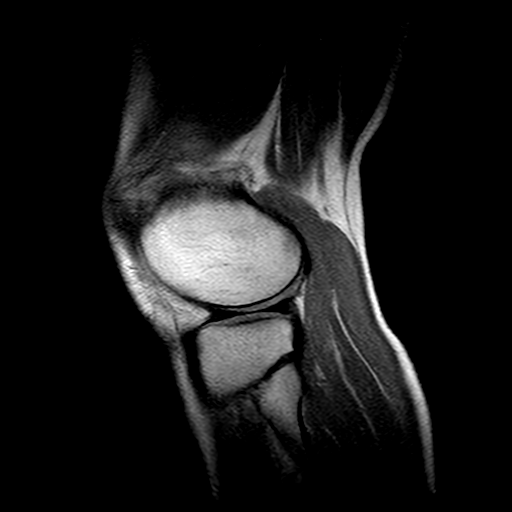

[22 of 40 positions shown; findings below may reference images not displayed]

RESSONÂNCIA MAGNÉTICA DO JOELHO DIREITO
INDICAÇÃO CLÍNICA:
Suspeita de condropatia patelar
TÉCNICA:
Ressonância magnética do joelho direito realizada sem contraste, com cortes nos planos axial, sagital e coronal, utilizando sequências ponderadas em T1, T2 e DP com supressão de gordura.
RESULTADO:
Os meniscos medial e lateral apresentam morfologia e sinal preservados, sem evidência de roturas ou degenerações.
Ligamentos cruzado anterior e posterior, bem como os ligamentos colaterais medial e lateral, apresentam trajeto e sinal normais.
Cartilagem patelar com aumento de sinal em T2, predominantemente no compartimento lateral, inferindo condropatia grau 1, sem fissuras ou irregularidades significativas.
Tendões do quadríceps e patelar bem definidos, sem sinais de tendinopatia.
Sem derrame articular ou distensão de bursas.
Componentes ósseos com morfologia preservada.
CONCLUSÃO:
Condropatia patelar grau 1, predominante na faceta lateral.
Demais estruturas sem alterações significativas.

## 2023-08-02 ENCOUNTER — Ambulatory Visit: Admitting: Urology
